# Patient Record
Sex: Male | Born: 1955
Health system: Southern US, Community
[De-identification: ages and names within clinical notes are randomized; demographics above are authoritative.]

## PROBLEM LIST (undated history)

## (undated) DIAGNOSIS — G4700 Insomnia, unspecified: Secondary | ICD-10-CM

## (undated) DIAGNOSIS — K648 Other hemorrhoids: Secondary | ICD-10-CM

## (undated) DIAGNOSIS — J301 Allergic rhinitis due to pollen: Secondary | ICD-10-CM

## (undated) DIAGNOSIS — K644 Residual hemorrhoidal skin tags: Secondary | ICD-10-CM

## (undated) DIAGNOSIS — M5416 Radiculopathy, lumbar region: Secondary | ICD-10-CM

## (undated) DIAGNOSIS — I10 Essential (primary) hypertension: Secondary | ICD-10-CM

## (undated) DIAGNOSIS — M549 Dorsalgia, unspecified: Secondary | ICD-10-CM

## (undated) DIAGNOSIS — D126 Benign neoplasm of colon, unspecified: Secondary | ICD-10-CM

## (undated) DIAGNOSIS — K5790 Diverticulosis of intestine, part unspecified, without perforation or abscess without bleeding: Secondary | ICD-10-CM

## (undated) HISTORY — DX: Other hemorrhoids: K64.8

## (undated) HISTORY — DX: Benign neoplasm of colon, unspecified: D12.6

## (undated) HISTORY — DX: Allergic rhinitis due to pollen: J30.1

## (undated) HISTORY — PX: BACK SURGERY: SHX140

## (undated) HISTORY — DX: Diverticulosis of intestine, part unspecified, without perforation or abscess without bleeding: K57.90

## (undated) HISTORY — DX: Insomnia, unspecified: G47.00

## (undated) HISTORY — DX: Residual hemorrhoidal skin tags: K64.4

## (undated) HISTORY — DX: Radiculopathy, lumbar region: M54.16

## (undated) HISTORY — PX: LUMBAR FUSION: SHX111

## (undated) HISTORY — PX: VASECTOMY: SHX75

---

## 2002-03-30 HISTORY — PX: LUMBAR DISC SURGERY: SHX700

## 2002-06-11 ENCOUNTER — Ambulatory Visit (HOSPITAL_COMMUNITY): Admission: RE | Admit: 2002-06-11 | Discharge: 2002-06-11 | Payer: Self-pay | Admitting: Neurosurgery

## 2002-06-11 ENCOUNTER — Encounter: Payer: Self-pay | Admitting: Neurosurgery

## 2002-12-13 ENCOUNTER — Encounter: Payer: Self-pay | Admitting: Neurosurgery

## 2002-12-15 ENCOUNTER — Encounter: Payer: Self-pay | Admitting: Neurosurgery

## 2002-12-15 ENCOUNTER — Ambulatory Visit (HOSPITAL_COMMUNITY): Admission: RE | Admit: 2002-12-15 | Discharge: 2002-12-16 | Payer: Self-pay | Admitting: Neurosurgery

## 2003-05-14 ENCOUNTER — Ambulatory Visit (HOSPITAL_COMMUNITY): Admission: RE | Admit: 2003-05-14 | Discharge: 2003-05-14 | Payer: Self-pay | Admitting: Neurosurgery

## 2004-04-29 ENCOUNTER — Inpatient Hospital Stay: Payer: Self-pay | Admitting: Internal Medicine

## 2004-08-23 ENCOUNTER — Emergency Department: Payer: Self-pay | Admitting: Emergency Medicine

## 2004-09-16 ENCOUNTER — Ambulatory Visit (HOSPITAL_COMMUNITY): Admission: RE | Admit: 2004-09-16 | Discharge: 2004-09-16 | Payer: Self-pay | Admitting: Neurosurgery

## 2004-09-26 ENCOUNTER — Emergency Department: Payer: Self-pay | Admitting: Unknown Physician Specialty

## 2005-01-18 ENCOUNTER — Emergency Department: Payer: Self-pay | Admitting: Internal Medicine

## 2005-04-14 ENCOUNTER — Inpatient Hospital Stay (HOSPITAL_COMMUNITY): Admission: RE | Admit: 2005-04-14 | Discharge: 2005-04-17 | Payer: Self-pay | Admitting: Neurosurgery

## 2005-05-18 ENCOUNTER — Emergency Department: Payer: Self-pay | Admitting: Emergency Medicine

## 2005-08-21 ENCOUNTER — Other Ambulatory Visit: Payer: Self-pay

## 2005-08-21 ENCOUNTER — Emergency Department: Payer: Self-pay | Admitting: Unknown Physician Specialty

## 2006-08-13 ENCOUNTER — Emergency Department: Payer: Self-pay | Admitting: Emergency Medicine

## 2006-10-23 ENCOUNTER — Emergency Department: Payer: Self-pay

## 2006-10-26 ENCOUNTER — Ambulatory Visit (HOSPITAL_COMMUNITY): Admission: RE | Admit: 2006-10-26 | Discharge: 2006-10-26 | Payer: Self-pay | Admitting: Neurosurgery

## 2006-11-07 ENCOUNTER — Inpatient Hospital Stay (HOSPITAL_COMMUNITY): Admission: EM | Admit: 2006-11-07 | Discharge: 2006-11-08 | Payer: Self-pay | Admitting: Emergency Medicine

## 2006-11-20 ENCOUNTER — Emergency Department (HOSPITAL_COMMUNITY): Admission: EM | Admit: 2006-11-20 | Discharge: 2006-11-20 | Payer: Self-pay | Admitting: Emergency Medicine

## 2006-11-24 ENCOUNTER — Emergency Department (HOSPITAL_COMMUNITY): Admission: EM | Admit: 2006-11-24 | Discharge: 2006-11-24 | Payer: Self-pay | Admitting: Emergency Medicine

## 2010-04-19 ENCOUNTER — Encounter: Payer: Self-pay | Admitting: Neurosurgery

## 2010-04-20 ENCOUNTER — Encounter: Payer: Self-pay | Admitting: Neurosurgery

## 2010-08-12 NOTE — Discharge Summary (Signed)
[name], [name]                ACCOUNT NO.:  [id number]   MEDICAL RECORD NO.:  [id number]          PATIENT TYPE:  INP   LOCATION:  3035                         FACILITY:  [facility]   PHYSICIAN:  Coletta Memos, M.D.     DATE OF BIRTH:  [birth year]   DATE OF ADMISSION:  11/07/2006  DATE OF DISCHARGE:  11/08/2006                               DISCHARGE SUMMARY   Jeffrey Mcbride is a gentleman who was admitted on November 07, 2006, being  discharged on November 08, 2006.   DISCHARGE DIAGNOSIS:  Back pain.   DISCHARGE DIAGNOSIS:  Back pain.   INDICATIONS:  Jeffrey Mcbride is a patient of Dr. Trey Sailors.  He presented  to the emergency room, after stating he was in so much pain he did not  want to do.  He came to the emergency room, and I contacted while in the  operating room.  At that point, the emergency room physician told Ms.  Stubblefield that he had the option that he should wait until I finish the  operation, so I could come down and evaluate him.  I saw Jeffrey Mcbride, and  he had no neurologic deficits.  He had no objective neurologic problems.  He stated he was in too much pain to go home.  He was therefore admitted  to the hospital.  Jeffrey Mcbride today stated that he wanted to leave the  hospital.  He said he felt that I was being aggressive towards him.  [name] was told numerous times that if he wished to remain in the hospital  he could, if he wished to leave he could.  Neurologically, again his  examination is normal and no deficits were identified.  Jeffrey Mcbride has  decided that he would like to leave.  He was given a prescription for  Valium, as he said that made him better than the Robaxin he had been  taking.           ______________________________  Coletta Memos, M.D.     KC/MEDQ  D:  11/08/2006  T:  11/09/2006  Job:  [id number]
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
## 2010-08-12 NOTE — H&P (Signed)
NAME:  YAMEN, CASTROGIOVANNI NO.:  [id number]   MEDICAL RECORD NO.:  [id number]           PATIENT TYPE:   LOCATION:                                 FACILITY:   PHYSICIAN:  Coletta Memos, M.D.          DATE OF BIRTH:   DATE OF ADMISSION:  DATE OF DISCHARGE:                              HISTORY & PHYSICAL   CONTINUATION   PHYSICAL EXAMINATION:  Mr. Derflinger is alert, oriented x4 and  answering all questions without  difficulty.  He is in absolutely no distress.  Pupils equal, round, and  react to light.  Full extraocular movements.  Tongue and uvula midline.  Shoulder shrug is normal.  Hearing intact to voice bilaterally.  He has  symmetric facial sensation and symmetric facial movements.  Normal  muscle tone, bulk and coordination.  He was lying down on a stretcher. I  did not assess his gait.  5/5 strength both the upper and lower  extremities.  Two plus reflexes at the biceps, triceps, brachial  radialis, knees and ankles.  Toes are downgoing to plantar stimulation.  No clonus was appreciated.  He has intact pinprick and light touch in  both the upper and lower extremities.  Proprioception intact in both the  upper and lower extremities.  He has no Hoffman sign appreciated on  examination.  There are no cervical masses or bruits.  Lung fields are  clear.  Pulses good at the wrists and feet bilaterally.  Heart exam  normal, no murmurs or rubs are appreciated.  No clubbing, cyanosis or  edema in the extremities.   RADIOGRAPHY:  Mr. Ceesay MRI was reviewed.  It shows cages in good  position.  He has granulation tissue with around the dissection site at  L5-S1.  Radiologist mentions disk material.  There is no disk material  there since he did have three cage fusion.  Plain films are done  flexion/extension views.  Unable to state whether or not he has a solid  fusion at that level.   ASSESSMENT:  Back pain.  I see no objective etiology for his continued  back pain.   Certainly that does not mean he does not hurt, but it does  mean there is very little to do I  believe clinically.  He will be  admitted for pain control.  I did explained that he may very well have  to pay for this out of his own pocket as many insurance companies will  not pay for admission secondary to pain control.  Nevertheless he would  like to be admitted.  He also understands the Dr. Channing Mutters is not available  in the early portion of next week.  No further diagnostic tests need to  be considered at this time.  Unless his neurologic exam were to change.           ______________________________  Coletta Memos, M.D.     KC/MEDQ  D:  11/07/2006  T:  11/08/2006  Job:  [id number]

## 2010-08-15 NOTE — H&P (Signed)
NAMEPHYLLIP, CLAW                ACCOUNT NO.:  [id number]   MEDICAL RECORD NO.:  [id number]          PATIENT TYPE:  INP   LOCATION:  2899                         FACILITY:  [facility]   PHYSICIAN:  Payton Doughty, M.D.      DATE OF BIRTH:  14-Jul-1955   DATE OF ADMISSION:  04/14/2005  DATE OF DISCHARGE:                                HISTORY & PHYSICAL   ADMITTING DIAGNOSIS:  Spondylosis L5-S1.   Self-referred 55 year old, right-handed white gentleman had a lumbar  diskectomy done about 3 years ago.  Has had some persistent but increasing  back pain and numbness of the left foot.  There is also some pain and spasm  in the right leg.  Undergone MR that shows loss of disc space height at L5-  S1, motic change at L5-S1, and is now admitted for fusion at that level.   MEDICAL HISTORY:  Otherwise benign.   MEDICATIONS:  1.  Robaxin 750 mg 3 times a day.  2.  Percocet 5 mg 3 times a day.  3.  Omeprazole 20 mg once a day.  4.  Atenolol 25 mg once a day.  5.  Elavil 50 mg at night.   He has no allergies.   SURGICAL HISTORY:  Only his diskectomy.   SOCIAL HISTORY:  He does not smoke and drink.  He was working in  [employer].  Cannot work because of back and leg pain.   REVIEW OF SYSTEMS:  Remarkable for glasses, tinnitus, hypertension, leg  pain, difficulty with urination, indigestion, leg weakness, back pain, leg  pain, joint pain, neck pain, difficulty with coordination, anxiety,  depression, and excessive thirst.   PHYSICAL EXAMINATION:  HEENT:  Within normal limits.  NECK:  He has reasonable range of motion in the neck.  CHEST:  Clear.  CARDIAC:  Regular rate and rhythm, without murmur.  ABDOMEN:  Nontender.  There is no hepatosplenomegaly.  EXTREMITIES:  Without clubbing or cyanosis.  GU:  Deferred.  PULSES:  Peripheral pulses are good.  NEUROLOGIC:  He is awake, alert, and oriented.  His cranial nerves are  intact.  Motor exam shows 5/5 strength throughout the upper and  lower  extremities, except for a little bit of plantar flexion weakness on the left  side.  Sensory deficit is in the left S1 distribution.  Reflexes are 2 at  the knees, 2 at the right ankle, absent at the left.  Straight leg raise and  reverse straight leg raise are both positive, and he has back pain.   MR results have been reviewed above.   CLINICAL IMPRESSION:  Lumbar spondylosis with progressive radiculopathy.   PLAN:  For lumbar laminectomy, diskectomy, posterior lumbar interbody fusion  with __________ fusion cages, and posterolateral arthrodesis.  The risks and  benefits of this approach have been discussed with him, and he wished to  proceed.           ______________________________  Payton Doughty, M.D.     MWR/MEDQ  D:  04/14/2005  T:  04/14/2005  Job:  [id number]

## 2010-08-15 NOTE — H&P (Signed)
NAME:  Jeffrey Mcbride, Jeffrey Mcbride                          ACCOUNT NO.:  [id number]   MEDICAL RECORD NO.:  [id number]                   PATIENT TYPE:  OIB   LOCATION:  3011                                 FACILITY:  [facility]   PHYSICIAN:  Payton Doughty, M.D.                   DATE OF BIRTH:  Dec 02, 1955   DATE OF ADMISSION:  12/15/2002  DATE OF DISCHARGE:                                HISTORY & PHYSICAL   ADMISSION DIAGNOSIS:  Herniated disk with left S1 radiculopathy.   HISTORY:  This is a 55 year old right-handed white gentleman whom I saw five  years ago with a disk of L5-S1 on the left.  He was lost to followup and  then reported back to my office in February with pain in his back and down  his left leg.  He was studied with a MRI which demonstrated a herniated disk  at L5-S1, off to the left side.  He received epidural steroids, did not  improve any, and now reports back for diskectomy at L5-S1.   PAST MEDICAL HISTORY:  Benign.   MEDICATIONS:  1. Methylcarbinol 750 mg t.i.d.  2. Percocet 5 mg t.i.d.  3. Omeprazole 20 mg once daily.  4. Atenolol 25 mg once daily.  5. Elavil 50 mg at night.   PAST SURGICAL HISTORY:  None.   ALLERGIES:  None.   SOCIAL HISTORY:  He does not smoke and does not drink.  Works in  [employer], but he says he cannot work now because of back and leg pain.   FAMILY HISTORY:  Not given.   REVIEW OF SYSTEMS:  Remarkable for glasses, tinnitus, hypertension, leg  pain, difficulty with urination, indigestion, leg weakness, back pain, joint  pain, neck pain, difficulty with coordination, anxiety, depression,  excessive thirst.   PHYSICAL EXAMINATION:  HEENT:  Within normal limits.  NECK:  Reasonable range of motion.  LUNGS:  Clear.  CARDIOVASCULAR:  Regular rate and rhythm without a murmur.  ABDOMEN:  Nontender without hepatosplenomegaly.  EXTREMITIES:  Without clubbing or cyanosis.  Peripheral pulses are good.  GENITOURINARY:  Deferred.  NEUROLOGICAL:  He is  awake, alert and oriented. Cranial nerves are intact.  Motor examination showed 5/5 strength throughout the upper and lower  extremities.  Questionable plantar flexion weakness on the left side.  Sensory deficit left S1 distribution. Reflexes are good, 2 at the knees, 2  at the right ankle, absent at the left. Straight leg raising and reverse  straight leg raising positive for left leg pain.   DIAGNOSTIC STUDIES:  MR demonstrated a disk at L5-S1 central and slightly  eccentric to the left.   CLINICAL IMPRESSION:  Left S1 radiculopathy related to disk.   PLAN:  Lumbar laminectomy and diskectomy. The risks and benefits of this  approach have been discussed with him; he wishes to proceed.  Payton Doughty, M.D.    MWR/MEDQ  D:  12/15/2002  T:  12/15/2002  Job:  [id number]

## 2010-08-15 NOTE — Op Note (Signed)
NAME:  Jeffrey Mcbride, Jeffrey Mcbride                          ACCOUNT NO.:  [id number]   MEDICAL RECORD NO.:  [id number]                   PATIENT TYPE:  OIB   LOCATION:  2870                                 FACILITY:  [facility]   PHYSICIAN:  Payton Doughty, M.D.                   DATE OF BIRTH:  1955/12/19   DATE OF PROCEDURE:  12/15/2002  DATE OF DISCHARGE:                                 OPERATIVE REPORT   PREOPERATIVE DIAGNOSIS:  Herniated disc on the left side at L5-S1.   POSTOPERATIVE DIAGNOSIS:  Herniated disc on the left side at L5-S1.   OPERATIVE PROCEDURE:  Left L5-S1 laminectomy, discectomy.   SURGEON:  Payton Doughty, M.D.   SERVICE:  Neurosurgery   ANESTHESIA:  General endotracheal anesthesia.   PREPARATION:  Betadine and alcohol wipe.   COMPLICATIONS:  None.   ASSISTANT:  Nurse   BODY OF TEXT:  This is a 55 year old gentleman with a left S1 radiculopathy.  He is taken to the operating room, general anesthesia, intubated, placed  prone on the operating table.  Following shave, prep, and drape in the usual  sterile fashion, the skin was infiltrated with 1% lidocaine with 1:100,000  epinephrine.  The skin was incised from the middle of L5 to the middle of S1  and the lamina of L5 and S1 were exposed on the left side in a subperiosteal  plane.  Interoperative x-ray confirmed correct level.  Having confirmed the  correct level, hemilaminectomy of L5 was carried out to the top of the  ligamentum flavum, it  was removed ina  retrograde fashion.  S1 was undercut  to allow foraminotomy over the top of the S1 nerve root.  The nerve root was  dissected free and retracted medially.  Immediately underneath it was found  a large calcified spur that was removed as well as disc material that is  compressing the left S1 nerve root as it entered the neuroforamen.  This was  removed without difficulty.  The annular fibers were divided and the disc  space carefully explored and all graftable fragments  were removed.  Having  removed the fragments, the wound was irrigated and hemostasis assured.  The  nerve root was carefully explored in all quadrants to make sure it was open.  Depo-Medrol soaked fat was used to cover the laminectomy defect.  The fascia  was reapproximated with 0 Vicryl in an interrupted fashion, the subcutaneous  tissues were reapproximated with 0 Vicryl in an interrupted fashion, the  subcuticular tissues were reapproximated with 3-0 Vicryl in an interrupted  fashion, and the skin was closed with 4-0 Vicryl in a running subcuticular  fashion.  Benzoin and Steri-Strips were placed and made occlusive with Telfa  and OpSite and the patient returned to the recovery room in good condition.      MWR/MEDQ  D:  12/15/2002  T:  12/15/2002  Job:  [id number]

## 2010-08-15 NOTE — Op Note (Signed)
NAMEBARNES, FLOREK                ACCOUNT NO.:  [id number]   MEDICAL RECORD NO.:  [id number]          PATIENT TYPE:  INP   LOCATION:  3172                         FACILITY:  [facility]   PHYSICIAN:  Payton Doughty, M.D.      DATE OF BIRTH:  1955/07/25   DATE OF PROCEDURE:  04/14/2005  DATE OF DISCHARGE:                                 OPERATIVE REPORT   PREOPERATIVE DIAGNOSIS:  Spondylosis L5-S1.   POSTOPERATIVE DIAGNOSIS:  Spondylosis L5-S1.   PROCEDURE:  L5-S1 laminectomy and diskectomy and posterior lumbar interbody  fusion with Ray Threaded fusion cages, posterolateral arthrodesis with  infusion extender   __________   ANESTHESIA:  General endotracheal.   __________   COMPLICATIONS:  None.   NEURO ASSISTANT  [place]   ASSISTANT:  Venetia Maxon.   BODY OF TEXT:  This is a 55 year old gentleman with severe spondylosis at L5-  S1.  He had had a previous diskectomy there.  He was taken to the operating  room __________  intubated.  Placed prone on the operating table.  Following  shave, prep and drape in the usual sterile fashion, the skin was infiltrated  with 1% lidocaine with 1:100,000 epinephrine and incised from mid L4 to mid  S1, and the lamina and transverse processes of L5 and the sacral ala were  exposed bilaterally in the subperiosteal plane.  Intraoperative x-ray  confirmed correct level.  The pars interarticularis, lamina and inferior  facet of L5 and superior facet of S1 were removed bilaterally using the high  speed drill.  Bone was set aside for grafting.  Ligamentum flavum was  removed, and the L5-S1 nerve roots were dissected free bilaterally.  Both S1  roots were very large and the L5 roots were of normal caliber.  There was  significant spondylosis, worse on the left than on the right.  Following  complete decompression, Ray Threaded fusion cages were placed, 12 x 21 mm,  without difficulty.  Intraoperative x-ray showed good placement of cages.  They were packed  with bone graft harvested from facet joints and capped, and  intertransverse BMP was placed after decortication.  The muscle and fascia  were reapproximated with 0 Vicryl in interrupted fashion.  The subcutaneous  tissues were reapproximated with 2-0 Vicryl in interrupted fashion.  The  skin was closed with 3-0 nylon in a running locked fashion.  Betadine and  Telfa dressing applied, made occlusive with OpSite, and then the patient  returned to the recovery room in good condition.           ______________________________  Payton Doughty, M.D.     MWR/MEDQ  D:  04/14/2005  T:  04/14/2005  Job:  [id number]

## 2010-08-15 NOTE — Discharge Summary (Signed)
NAMEROBYN, NOHR                ACCOUNT NO.:  [id number]   MEDICAL RECORD NO.:  [id number]          PATIENT TYPE:  INP   LOCATION:  3013                         FACILITY:  [facility]   PHYSICIAN:  Payton Doughty, M.D.      DATE OF BIRTH:  01-16-1956   DATE OF ADMISSION:  04/14/2005  DATE OF DISCHARGE:  04/17/2005                                 DISCHARGE SUMMARY   ADMISSION DIAGNOSIS:  Spondylosis L5-S1.   DISCHARGE DIAGNOSIS:  Spondylosis L5-S1.   OPERATION/PROCEDURE:  L5-S1 laminectomy, diskectomy, posterior lumbar  interbody fusion cages, and posterolateral arthrodesis.   SURGEON:  Payton Doughty, M.D.   COMPLICATIONS:  None.   CONDITION ON DISCHARGE:  Satisfactory __________ .   HISTORY OF PRESENT ILLNESS:  This is a 55 year old gentleman with history  and physical on the chart.  Had L5-S1 done several years ago. Has had  progression of spondylosis. Admitted for fusion.  Medical history is benign.  General exam is intact.  Neurologic exam is intact with back and lower  extremity pain.   HOSPITAL COURSE:  He was admitted after ascertaining normal laboratory  values and underwent fusion at L5-S1.  Postoperatively he has done well.  His strength is full. His incision is dry.  He had some nausea which kept  him in the bed for a day or so. He stopped the PCA and he was put back on  his Percocet.  He is now awake and doing fine.  Strength is full. Incision  dry and healing well.  He is up and walking independently. Pain is  controlled with Percocet.  He is being discharged home to the care of his  family on Percocet and ciprofloxacin.  He is to follow up with me at the  [facility] office in about 10 days for sutures.           ______________________________  Payton Doughty, M.D.     MWR/MEDQ  D:  04/17/2005  T:  04/17/2005  Job:  [id number]

## 2010-09-11 ENCOUNTER — Emergency Department: Payer: Self-pay | Admitting: Emergency Medicine

## 2011-01-12 LAB — CREATININE, SERUM
Creatinine, Ser: 1.4
GFR calc Af Amer: >60

## 2011-01-12 LAB — BUN: BUN: 11

## 2011-10-13 ENCOUNTER — Emergency Department (HOSPITAL_COMMUNITY): Payer: Medicare Other

## 2011-10-13 ENCOUNTER — Encounter (HOSPITAL_COMMUNITY): Payer: Self-pay

## 2011-10-13 ENCOUNTER — Emergency Department (HOSPITAL_COMMUNITY)
Admission: EM | Admit: 2011-10-13 | Discharge: 2011-10-13 | Disposition: A | Discharge status: Home / Self Care | Payer: Medicare Other | Attending: Emergency Medicine | Admitting: Emergency Medicine

## 2011-10-13 DIAGNOSIS — M545 Low back pain, unspecified: Secondary | ICD-10-CM | POA: Insufficient documentation

## 2011-10-13 DIAGNOSIS — I10 Essential (primary) hypertension: Secondary | ICD-10-CM | POA: Insufficient documentation

## 2011-10-13 DIAGNOSIS — IMO0002 Reserved for concepts with insufficient information to code with codable children: Secondary | ICD-10-CM

## 2011-10-13 DIAGNOSIS — M47817 Spondylosis without myelopathy or radiculopathy, lumbosacral region: Secondary | ICD-10-CM | POA: Insufficient documentation

## 2011-10-13 DIAGNOSIS — R209 Unspecified disturbances of skin sensation: Secondary | ICD-10-CM | POA: Insufficient documentation

## 2011-10-13 HISTORY — DX: Essential (primary) hypertension: I10

## 2011-10-13 HISTORY — DX: Dorsalgia, unspecified: M54.9

## 2011-10-13 MED ORDER — ONDANSETRON HCL 4 MG/2ML IJ SOLN
4.0000 mg | Freq: Once | INTRAMUSCULAR | Status: AC
  Administered 2011-10-13: 4 mg via INTRAVENOUS
  Filled 2011-10-13: qty 2

## 2011-10-13 MED ORDER — PREDNISONE 20 MG PO TABS: 60.0000 mg | ORAL_TABLET | Freq: Every day | ORAL | Status: AC

## 2011-10-13 MED ORDER — PREDNISONE 20 MG PO TABS: 60.0000 mg | ORAL_TABLET | Freq: Every day | ORAL | Status: DC

## 2011-10-13 MED ORDER — CYCLOBENZAPRINE HCL 10 MG PO TABS: 10.0000 mg | ORAL_TABLET | Freq: Two times a day (BID) | ORAL | Status: DC | PRN

## 2011-10-13 MED ORDER — OXYCODONE-ACETAMINOPHEN 5-325 MG PO TABS: 1.0000 | ORAL_TABLET | Freq: Four times a day (QID) | ORAL | Status: DC | PRN

## 2011-10-13 MED ORDER — SODIUM CHLORIDE 0.9 % IV BOLUS (SEPSIS)
500.0000 mL | Freq: Once | INTRAVENOUS | Status: AC
  Administered 2011-10-13: 500 mL via INTRAVENOUS

## 2011-10-13 MED ORDER — OXYCODONE-ACETAMINOPHEN 5-325 MG PO TABS: 1.0000 | ORAL_TABLET | Freq: Four times a day (QID) | ORAL | Status: AC | PRN

## 2011-10-13 MED ORDER — HYDROMORPHONE HCL PF 1 MG/ML IJ SOLN
1.0000 mg | Freq: Once | INTRAMUSCULAR | Status: AC
  Administered 2011-10-13: 1 mg via INTRAVENOUS
  Filled 2011-10-13: qty 1

## 2011-10-13 MED ORDER — CYCLOBENZAPRINE HCL 10 MG PO TABS: 10.0000 mg | ORAL_TABLET | Freq: Two times a day (BID) | ORAL | Status: AC | PRN

## 2011-10-13 NOTE — ED Provider Notes (Addendum)
History   This chart was scribed for Donnetta Hutching, MD by Charolett Bumpers . The patient was seen in room TR08C/TR08C.    CSN: [id number]  Arrival date & time 10/13/11  1459   First MD Initiated Contact with Patient 10/13/11 [phone]      Chief Complaint  Patient presents with  . Back Pain    (Consider location/radiation/quality/duration/timing/severity/associated sxs/prior treatment) HPI Jeffrey Mcbride is a 56 y.o. male who presents to the Emergency Department complaining of constant, moderate mid back pain for the past month, that has worsened in the past 2 days. Pt states that he was on vacation in mid June, where he may have twisted his back and has had constant back pain. Pt states that the back pain has worsened over the past 2 days. Pt states that he is able to ambulate with moderate pain. Pt reports associated left foot numbness but is not a new symptom, but states that it worse than usual. Pt reports a h/o 2 back surgeries one in 2004 and one in 2007, and has 2 titanium rods in his back near his L5. Pt states that his last MRI was in 2010. Pt states that he has not been seen by Dr. Channing Mutters for his current symptoms and was told to find a PCP. Pt states that he took Oxy IR this morning with no relief.   Back specialist: Dr. Channing Mutters  Past Medical History  Diagnosis Date  . Back pain   . Hypertension     Past Surgical History  Procedure Date  . Back surgery     History reviewed. No pertinent family history.  History  Substance Use Topics  . Smoking status: Never Smoker   . Smokeless tobacco: Not on file  . Alcohol Use: Yes     "very little"      Review of Systems A complete 10 system review of systems was obtained and all systems are negative except as noted in the HPI and PMH.   Allergies  Motrin  Home Medications  No current outpatient prescriptions on file.  BP 150/90  Pulse 53  Temp 98.2 F (36.8 C) (Oral)  Resp 18  SpO2 100%  Physical Exam  Nursing note  and vitals reviewed. Constitutional: He is oriented to person, place, and time. He appears well-developed and well-nourished. No distress.  HENT:  Head: Normocephalic and atraumatic.  Eyes: EOM are normal.  Neck: Neck supple. No tracheal deviation present.  Pulmonary/Chest: Effort normal. No respiratory distress.  Musculoskeletal: Normal range of motion. He exhibits tenderness.       Tenderness to left L1 spine.   Neurological: He is alert and oriented to person, place, and time.  Skin: Skin is warm and dry.  Psychiatric: He has a normal mood and affect. His behavior is normal.    ED Course  Procedures (including critical care time)  DIAGNOSTIC STUDIES: Oxygen Saturation is 100% on room air, normal by my interpretation.    COORDINATION OF CARE:  15:25-Discussed planned course of treatment with the patient including pain medication and a x-ray, who is agreeable at this time.   15:41-Medication Orders: Sodium chloride 0.9% bolus 500 mL-once; Ondansetron (Zofran) injection 4 mg-once; Hydromorphone (Dilaudid) injection 1 mg-once   Labs Reviewed - No data to display No results found.   No diagnosis found.  Dg Lumbar Spine Complete  10/13/2011  *RADIOLOGY REPORT*  Clinical Data: Increasing chronic low back pain.  History of lumbar surgery.  LUMBAR SPINE - COMPLETE 4+  VIEW  Comparison: The lumbar spine MRI S/29/2008.  Plain film radiographs of the lumbar spine 10/26/2006.  Findings: The patient is status post fusion at L5-S1.  Alignment is stable.  Vertebral body heights and disc spaces are otherwise maintained.  Slight retrolisthesis present at L4-5.  Facet degenerative changes are again noted at to L4-5.  IMPRESSION:  1.  Mild facet degenerative changes at L4-5 with slight retrolisthesis is stable. 2.  Status post fusion at L5-S1. 3.  No acute or focal abnormality to explain the patient's symptoms.  Original Report Authenticated By: Jamesetta Orleans. MATTERN, M.D.   Mr Lumbar Spine Wo  Contrast  10/13/2011  *RADIOLOGY REPORT*  Clinical Data: Increasing low back pain radiating to left foot. Prior back surgery 2008  MRI LUMBAR SPINE WITHOUT CONTRAST  Technique:  Multiplanar and multiecho pulse sequences of the lumbar spine were obtained without intravenous contrast.  Comparison: Lumbar MRI 10/26/2006  Findings: Ray cage fusion L5-S1.  Negative for fracture or mass. The alignment is normal.  Conus medullaris is normal and terminates at T12-L1.  Right upper pole renal cyst.  T12-L1:  Negative  L1-2:  Negative  L2-3:  Negative.  L3-4:  Central disc protrusion, mild to moderate in size.  This has increased from the prior study.  Mild facet hypertrophy.  Mild spinal stenosis has progressed in the interval.  L4-5:  Disc degeneration with mild spondylosis.  Mild facet hypertrophy.  No change from the  prior MRI.  L5-S1:  Ray cage fusion.  Spondylosis is present.  Posterior decompression without significant spinal stenosis.  IMPRESSION: Mild to moderate central disc protrusion at L3-4 with mild spinal stenosis.  Spondylosis and facet degeneration at L4-5, stable.  Bilateral ray cage fusion L5-S1, unchanged.  Original Report Authenticated By: Camelia Phenes, M.D.    MDM  Status post previous lower back surgery by Dr. Channing Mutters with titanium rod insertion.  Assistant lower back pain and radiation to left foot.   Pain  management, plain films, MRI lumbar spine.  Transfer to CDU. I personally performed the services described in this documentation, which was scribed in my presence. The recorded information has been reviewed and considered.  MRI suggests L3-4 herniated nucleus pulposus.  Referral to neurosurgeon. Rx for Percocet, Flexeril, prednisone.  No bowel or bladder incontinence       Donnetta Hutching, MD 10/13/11 1604  Donnetta Hutching, MD 10/13/11 2013

## 2011-10-13 NOTE — ED Provider Notes (Signed)
MSE was initiated and I personally evaluated the patient and placed orders (if any) at  8:50 PM on October 13, 2011.  The remainder of the MSE may be completed by another ED provider. Reprinted prescriptions as discharge instructions misplaced   Arman Filter, NP 10/13/11 2050

## 2011-10-13 NOTE — ED Notes (Signed)
Pt states "I took an oxy IR this morning and it didn't help."

## 2011-10-13 NOTE — ED Notes (Signed)
Pt states "I have a cage in my back and had multiple surgeries. I went on vacation and my hips got out of whack and now I can't even put my socks and shoes on. This morning I could barely get out of bed and I can't handle it and I think I messed something up and need pain medication." Denies any "popping or cracking."

## 2011-10-13 NOTE — ED Notes (Signed)
Pt. Denies any new numbness or tingling in his extremities.  Pt. Reports that he has had lt. Foot numbness since his first  Lower back surgery.   Pt. Denies any problems with voiding or moving his bowels

## 2011-10-14 NOTE — ED Provider Notes (Signed)
Medical screening examination/treatment/procedure(s) were performed by non-physician practitioner and as supervising physician I was immediately available for consultation/collaboration.  Donnetta Hutching, MD 10/14/11 [phone]

## 2014-08-10 ENCOUNTER — Emergency Department
Admission: EM | Admit: 2014-08-10 | Discharge: 2014-08-10 | Disposition: A | Discharge status: Home / Self Care | Payer: Medicare Other | Attending: Emergency Medicine | Admitting: Emergency Medicine

## 2014-08-10 ENCOUNTER — Other Ambulatory Visit: Payer: Self-pay

## 2014-08-10 ENCOUNTER — Emergency Department: Payer: Medicare Other

## 2014-08-10 DIAGNOSIS — G8929 Other chronic pain: Secondary | ICD-10-CM | POA: Diagnosis not present

## 2014-08-10 DIAGNOSIS — M545 Low back pain: Secondary | ICD-10-CM | POA: Diagnosis present

## 2014-08-10 DIAGNOSIS — M5442 Lumbago with sciatica, left side: Secondary | ICD-10-CM | POA: Diagnosis not present

## 2014-08-10 DIAGNOSIS — I10 Essential (primary) hypertension: Secondary | ICD-10-CM | POA: Diagnosis not present

## 2014-08-10 DIAGNOSIS — M5432 Sciatica, left side: Secondary | ICD-10-CM

## 2014-08-10 LAB — BASIC METABOLIC PANEL
Anion gap: 7 (ref 5–15)
BUN: 14 mg/dL (ref 6–20)
CALCIUM: 8.9 mg/dL (ref 8.9–10.3)
CHLORIDE: 103 mmol/L (ref 101–111)
CO2: 30 mmol/L (ref 22–32)
Creatinine, Ser: 1.2 mg/dL (ref 0.61–1.24)
GFR calc Af Amer: >60 mL/min (ref >60)
GFR calc non Af Amer: >60 mL/min (ref >60)
GLUCOSE: 103 mg/dL — AB (ref 65–99)
POTASSIUM: 3.7 mmol/L (ref 3.5–5.1)
SODIUM: 140 mmol/L (ref 135–145)

## 2014-08-10 LAB — CBC
HEMATOCRIT: 41.8 % (ref 40.0–52.0)
Hemoglobin: 14 g/dL (ref 13.0–18.0)
MCH: 29.8 pg (ref 26.0–34.0)
MCHC: 33.5 g/dL (ref 32.0–36.0)
MCV: 88.8 fL (ref 80.0–100.0)
PLATELETS: 222 10*3/uL (ref 150–440)
RBC: 4.71 MIL/uL (ref 4.40–5.90)
RDW: 13.4 % (ref 11.5–14.5)
WBC: 10.9 10*3/uL — AB (ref 3.8–10.6)

## 2014-08-10 LAB — URINALYSIS COMPLETE WITH MICROSCOPIC (ARMC ONLY)
BILIRUBIN URINE: NEGATIVE
Bacteria, UA: NONE SEEN
GLUCOSE, UA: NEGATIVE mg/dL
HGB URINE DIPSTICK: NEGATIVE
LEUKOCYTES UA: NEGATIVE
NITRITE: NEGATIVE
PH: 9 — AB (ref 5.0–8.0)
PROTEIN: NEGATIVE mg/dL
Specific Gravity, Urine: 1.015 (ref 1.005–1.030)
Squamous Epithelial / LPF: NONE SEEN

## 2014-08-10 MED ORDER — ONDANSETRON HCL 4 MG/2ML IJ SOLN
4.0000 mg | Freq: Once | INTRAMUSCULAR | Status: AC
  Administered 2014-08-10: 4 mg via INTRAVENOUS

## 2014-08-10 MED ORDER — ONDANSETRON HCL 4 MG/2ML IJ SOLN
INTRAMUSCULAR | Status: AC
  Administered 2014-08-10: 4 mg via INTRAVENOUS
  Filled 2014-08-10: qty 2

## 2014-08-10 MED ORDER — PREDNISONE 20 MG PO TABS
60.0000 mg | ORAL_TABLET | Freq: Once | ORAL | Status: AC
  Administered 2014-08-10: 60 mg via ORAL

## 2014-08-10 MED ORDER — HYDROMORPHONE HCL 1 MG/ML IJ SOLN
1.0000 mg | Freq: Once | INTRAMUSCULAR | Status: AC
  Administered 2014-08-10: 1 mg via INTRAVENOUS

## 2014-08-10 MED ORDER — HYDROMORPHONE HCL 1 MG/ML IJ SOLN
INTRAMUSCULAR | Status: AC
  Administered 2014-08-10: 1 mg via INTRAVENOUS
  Filled 2014-08-10: qty 1

## 2014-08-10 MED ORDER — PREDNISONE 10 MG PO TABS: ORAL_TABLET | ORAL | Status: DC

## 2014-08-10 MED ORDER — PREDNISONE 20 MG PO TABS
ORAL_TABLET | ORAL | Status: AC
  Administered 2014-08-10: 60 mg via ORAL
  Filled 2014-08-10: qty 3

## 2014-08-10 MED ORDER — OXYCODONE-ACETAMINOPHEN 5-325 MG PO TABS: 1.0000 | ORAL_TABLET | ORAL | Status: DC | PRN

## 2014-08-10 NOTE — ED Notes (Addendum)
Pt states he has been having lower back pain and weakness in his legs, also c/o headache and dizziness, states all of this started yesterday, hx of HTN, pt states he also felt like he was going to pass out while walking into the ED

## 2014-08-10 NOTE — Discharge Instructions (Signed)
°  You're being treated for acute exacerbation of chronic back pain due to presumed pinched nerve. Return to the emergency department for any new or worsening back pain, leg pain, weakness, numbness, or peeing or pooping on yourself. You need to establish with a primary care doctor who can consider further evaluation and management of your back pain.  Radicular Pain Radicular pain in either the arm or leg is usually from a bulging or herniated disk in the spine. A piece of the herniated disk may press against the nerves as the nerves exit the spine. This causes pain which is felt at the tips of the nerves down the arm or leg. Other causes of radicular pain may include:  Fractures.  Heart disease.  Cancer.  An abnormal and usually degenerative state of the nervous system or nerves (neuropathy). Diagnosis may require CT or MRI scanning to determine the primary cause.  Nerves that start at the neck (nerve roots) may cause radicular pain in the outer shoulder and arm. It can spread down to the thumb and fingers. The symptoms vary depending on which nerve root has been affected. In most cases radicular pain improves with conservative treatment. Neck problems may require physical therapy, a neck collar, or cervical traction. Treatment may take many weeks, and surgery may be considered if the symptoms do not improve.  Conservative treatment is also recommended for sciatica. Sciatica causes pain to radiate from the lower back or buttock area down the leg into the foot. Often there is a history of back problems. Most patients with sciatica are better after 2 to 4 weeks of rest and other supportive care. Short term bed rest can reduce the disk pressure considerably. Sitting, however, is not a good position since this increases the pressure on the disk. You should avoid bending, lifting, and all other activities which make the problem worse. Traction can be used in severe cases. Surgery is usually reserved for  patients who do not improve within the first months of treatment. Only take over-the-counter or prescription medicines for pain, discomfort, or fever as directed by your caregiver. Narcotics and muscle relaxants may help by relieving more severe pain and spasm and by providing mild sedation. Cold or massage can give significant relief. Spinal manipulation is not recommended. It can increase the degree of disc protrusion. Epidural steroid injections are often effective treatment for radicular pain. These injections deliver medicine to the spinal nerve in the space between the protective covering of the spinal cord and back bones (vertebrae). Your caregiver can give you more information about steroid injections. These injections are most effective when given within two weeks of the onset of pain.  You should see your caregiver for follow up care as recommended. A program for neck and back injury rehabilitation with stretching and strengthening exercises is an important part of management.  SEEK IMMEDIATE MEDICAL CARE IF:  You develop increased pain, weakness, or numbness in your arm or leg.  You develop difficulty with bladder or bowel control.  You develop abdominal pain. Document Released: 04/23/2004 Document Revised: 06/08/2011 Document Reviewed: 07/09/2008 [facility] Patient Information 2015 [place], [place]. This information is not intended to replace advice given to you by your health care provider. Make sure you discuss any questions you have with your health care provider.

## 2014-08-10 NOTE — ED Notes (Signed)
Returned from radiology. 

## 2014-08-10 NOTE — ED Provider Notes (Signed)
[facility] Emergency Department Provider Note   ____________________________________________  Time seen: 3:30 PM  I have reviewed the triage vital signs and the triage nursing note.   HISTORY  Chief Complaint Dizziness; Back Pain; and Headache   HPI Jeffrey Mcbride is a 59 y.o. male is presenting with 2-3 days of moderate to severe low back pain with numbness of the left lower extremity down to the knee. He believes he has "slipped a disc. "He has a history of chronic back pain and L5 "rods ". He's had no bowel or bladder incontinence. He does report having a hemorrhoid which has created a small amount of rectal bleeding. He says he's been shaking due to pain. He's had trouble sleeping due to pain. He's been taking Tylenol and ibuprofen at home for the past 2-3 days.      Past Medical History  Diagnosis Date  . Back pain   . Hypertension     There are no active problems to display for this patient.   Past Surgical History  Procedure Laterality Date  . Back surgery      Current Outpatient Rx  Name  Route  Sig  Dispense  Refill  . acetaminophen (TYLENOL) 500 MG tablet   Oral   Take 250-500 mg by mouth every 6 (six) hours as needed. For pain         . oxyCODONE (OXY IR/ROXICODONE) 5 MG immediate release tablet   Oral   Take 5 mg by mouth daily as needed. For pain           Allergies Motrin  No family history on file.  Social History History  Substance Use Topics  . Smoking status: Never Smoker   . Smokeless tobacco: Not on file  . Alcohol Use: Yes     Comment: "very little"    Review of Systems  Constitutional: Negative for fever. Eyes: Negative for visual changes. ENT: Negative for sore throat. Cardiovascular: Negative for chest pain. Respiratory: Negative for shortness of breath. Gastrointestinal: Mild gas today after a gravy biscuit with red pepper Genitourinary: Negative for dysuria. Musculoskeletal: No joint  problems. Skin: Negative for rash. Neurological: Negative for headaches, focal weakness or numbness.   ____________________________________________   PHYSICAL EXAM:  VITAL SIGNS: ED Triage Vitals  Enc Vitals Group     BP 08/10/14 1312 161/86 mmHg     Pulse Rate 08/10/14 1312 65     Resp 08/10/14 1312 18     Temp 08/10/14 1312 98.5 F (36.9 C)     Temp Source 08/10/14 1312 Oral     SpO2 08/10/14 1312 99 %     Weight 08/10/14 1311 145 lb (65.772 kg)     Height 08/10/14 1311 5\' 9"  (1.753 m)     Head Cir --      Peak Flow --      Pain Score 08/10/14 1311 10     Pain Loc --      Pain Edu? --      Excl. in [place]? --      Constitutional: Alert and oriented. Well appearing but wincing due to pain in the back. Eyes: Conjunctivae are normal. PERRL. Normal extraocular movements. ENT   Head: Normocephalic and atraumatic.   Nose: No congestion/rhinnorhea.   Mouth/Throat: Mucous membranes are moist. Poor dentition   Neck: No stridor. Cardiovascular: Normal rate, regular rhythm.  No murmurs, rubs, or gallops. Respiratory: Normal respiratory effort without tachypnea nor retractions. Breath sounds are clear and  equal bilaterally. No wheezes/rales/rhonchi. Gastrointestinal: Soft and nontender. No distention.  Genitourinary: Musculoskeletal: Tenderness in the low back paraspinous bilaterally. Nontender with normal range of motion in all extremities. No joint effusions.  No lower extremity tenderness nor edema. Neurologic:  Normal speech and language. No gross focal neurologic deficits are appreciated. Speech is normal. No gait instability. Skin:  Skin is warm, dry and intact. No rash noted. Psychiatric: Mood and affect are normal. Speech and behavior are normal. Patient exhibits appropriate insight and judgment.  ____________________________________________   EKG  62 bpm. Normal sinus rhythm. Normal axis. Normal QRS. Normal ST and  T-wave.   ____________________________________________   LABS (pertinent positives/negatives)  White blood cell count 10.9 Hemoglobin 14.0 Electrolytes within normal limits Urinalysis negative  ____________________________________________    RADIOLOGY Radiologist results reviewed  Lumbar spine 2 view: Stable postoperative changes at L5-S1  ____________________________________________   PROCEDURES  Procedure(s) performed:  Critical Care performed:   ____________________________________________   INITIAL IMPRESSION / ASSESSMENT AND PLAN / ED COURSE  Pertinent labs & imaging results that were available during my care of the patient were reviewed by me and considered in my medical decision making (see chart for details).  Clinically I suspect acute exacerbation of chronic back pain with left-sided sciatica. There is no indication on his history of physical exam to make me concern for a spinal emergency such as epidural abscess, or cauda equina, etc.  Patient not symptomatic relief after Dilaudid. However the pain did come back and he was given a second dose. Patient states he cannot tolerate ibuprofen and that a course of prednisone had worked in the past for anti-inflammatory pain relief and I will go ahead and give a prescription for this. He'll be given 3 days worth of Percocet for his acute back pain. He is instructed to follow with primary care doctor and states that he would like to get a new primary care doctor and will be given contact operation for credit clinic. Return precautions discussed with the patient and his wife. ____________________________________________   FINAL CLINICAL IMPRESSION(S) / ED DIAGNOSES  Acute exacerbation of chronic low back pain with left-sided sciatica     Lisa Roca, MD 08/10/14 1843

## 2014-08-12 ENCOUNTER — Emergency Department (HOSPITAL_COMMUNITY)
Admission: EM | Admit: 2014-08-12 | Discharge: 2014-08-12 | Disposition: A | Discharge status: Home / Self Care | Payer: Medicare Other | Attending: Emergency Medicine | Admitting: Emergency Medicine

## 2014-08-12 ENCOUNTER — Encounter (HOSPITAL_COMMUNITY): Payer: Self-pay | Admitting: Emergency Medicine

## 2014-08-12 DIAGNOSIS — Z9889 Other specified postprocedural states: Secondary | ICD-10-CM | POA: Diagnosis not present

## 2014-08-12 DIAGNOSIS — Z79899 Other long term (current) drug therapy: Secondary | ICD-10-CM | POA: Diagnosis not present

## 2014-08-12 DIAGNOSIS — G8929 Other chronic pain: Secondary | ICD-10-CM | POA: Diagnosis not present

## 2014-08-12 DIAGNOSIS — M545 Low back pain: Secondary | ICD-10-CM | POA: Diagnosis not present

## 2014-08-12 DIAGNOSIS — I10 Essential (primary) hypertension: Secondary | ICD-10-CM | POA: Diagnosis not present

## 2014-08-12 MED ORDER — HYDROMORPHONE HCL 1 MG/ML IJ SOLN
1.0000 mg | Freq: Once | INTRAMUSCULAR | Status: AC
Start: 2014-08-12 — End: 2014-08-12
  Administered 2014-08-12: 1 mg via INTRAVENOUS
  Filled 2014-08-12: qty 1

## 2014-08-12 MED ORDER — METHOCARBAMOL 1000 MG/10ML IJ SOLN
1000.0000 mg | Freq: Once | INTRAVENOUS | Status: AC
  Administered 2014-08-12: 1000 mg via INTRAVENOUS
  Filled 2014-08-12: qty 10

## 2014-08-12 MED ORDER — CYCLOBENZAPRINE HCL 10 MG PO TABS: 10.0000 mg | ORAL_TABLET | Freq: Two times a day (BID) | ORAL | Status: DC | PRN

## 2014-08-12 MED ORDER — KETOROLAC TROMETHAMINE 30 MG/ML IJ SOLN
30.0000 mg | Freq: Once | INTRAMUSCULAR | Status: AC
  Administered 2014-08-12: 30 mg via INTRAVENOUS
  Filled 2014-08-12: qty 1

## 2014-08-12 MED ORDER — METHOCARBAMOL 1000 MG/10ML IJ SOLN: 1000.0000 mg | Freq: Once | INTRAMUSCULAR | Status: DC

## 2014-08-12 NOTE — ED Provider Notes (Signed)
CSN: [id number]     Arrival date & time 08/12/14  1253 History   First MD Initiated Contact with Patient 08/12/14 1253     Chief Complaint  Patient presents with  . Back Pain    chronic     (Consider location/radiation/quality/duration/timing/severity/associated sxs/prior Treatment) HPI Comments: The patient is a 59 year old male, he has a history of low back pain, he has a history of intermittent flareups since approximately 15 years ago. He also has a history of chronic left foot pain and numbness secondary to his low back since 2004 when he had surgery. He reports that his most recent episode of pain started several days ago, is located in the lower back, radiates down the left leg, is associated with muscle spasm, he feels as though he has a constant tremor and take comfortable, he had been seen at an emergency Department at [facility], was evaluated with lumbar spine films, was given pain medications and had significant improvement in his symptoms. He was discharged home with Percocet, he has been taking it sparingly per his report and last night developed worsening pain in his lower back, has been off the majority of the evening  as he was unable to get comfortable. paramedics gave the patient 200 g of fentanyl with some improvement, the patient denies any urinary or stool incontinence, no new numbness or weakness of the legs in fact he has been ambulatory today without difficulty in his legs though it does make his back worse to ambulate.  Patient is a 59 y.o. male presenting with back pain. The history is provided by the patient.  Back Pain   Past Medical History  Diagnosis Date  . Back pain   . Hypertension    Past Surgical History  Procedure Laterality Date  . Back surgery     History reviewed. No pertinent family history. History  Substance Use Topics  . Smoking status: Never Smoker   . Smokeless tobacco: Not on file  . Alcohol Use: Yes     Comment: "very little"     Review of Systems  Musculoskeletal: Positive for back pain.  All other systems reviewed and are negative.     Allergies  Motrin  Home Medications   Prior to Admission medications   Medication Sig Start Date End Date Taking? Authorizing Provider  acetaminophen (TYLENOL) 500 MG tablet Take 250-500 mg by mouth every 6 (six) hours as needed for headache.    Yes Historical Provider, MD  MELATONIN PO Take 0.25 tablets by mouth at bedtime as needed (for sleep).   Yes Historical Provider, MD  Multiple Vitamin (MULTIVITAMIN WITH MINERALS) TABS tablet Take 1 tablet by mouth daily. Gummy vitamin   Yes Historical Provider, MD  oxyCODONE-acetaminophen (ROXICET) 5-325 MG per tablet Take 1 tablet by mouth every 4 (four) hours as needed for severe pain. 08/10/14  Yes Lisa Roca, MD  predniSONE (DELTASONE) 10 MG tablet 40mg  daily for 2 days 30 mg daily for 2 days 20 mg daily for 2 days 10mg  daily for 2 days 08/10/14  Yes Lisa Roca, MD  cyclobenzaprine (FLEXERIL) 10 MG tablet Take 1 tablet (10 mg total) by mouth 2 (two) times daily as needed for muscle spasms. 08/12/14   Noemi Chapel, MD   BP 146/69 mmHg  Pulse 47  Temp(Src) 98.2 F (36.8 C) (Oral)  Resp 20  SpO2 99% Physical Exam  Constitutional: He appears well-developed and well-nourished. No distress.  HENT:  Head: Normocephalic and atraumatic.  Mouth/Throat: Oropharynx is  clear and moist. No oropharyngeal exudate.  Eyes: Conjunctivae and EOM are normal. Pupils are equal, round, and reactive to light. Right eye exhibits no discharge. Left eye exhibits no discharge. No scleral icterus.  Neck: Normal range of motion. Neck supple. No JVD present. No thyromegaly present.  Cardiovascular: Normal rate, regular rhythm, normal heart sounds and intact distal pulses.  Exam reveals no gallop and no friction rub.   No murmur heard. Pulmonary/Chest: Effort normal and breath sounds normal. No respiratory distress. He has no wheezes. He has no  rales.  Abdominal: Soft. Bowel sounds are normal. He exhibits no distension and no mass. There is no tenderness.  Musculoskeletal: Normal range of motion. He exhibits tenderness (tender to palpation across the lumbar spine). He exhibits no edema.  Pain with movement of the bilateral legs located in the thighs, calves and lower back, supple joints, soft compartments  Lymphadenopathy:    He has no cervical adenopathy.  Neurological: He is alert. Coordination normal.  Skin: Skin is warm and dry. No rash noted. No erythema.  Psychiatric: He has a normal mood and affect. His behavior is normal.  Nursing note and vitals reviewed.   ED Course  Procedures (including critical care time) Labs Review Labs Reviewed - No data to display  Imaging Review Dg Lumbar Spine 2-3 Views  08/10/2014   CLINICAL DATA:  Acute onset lumbago with left lower extremity numbness after bending  EXAM: LUMBAR SPINE - 2-3 VIEW  COMPARISON:  December 02, 2011  FINDINGS: Frontal, lateral, and spot lumbosacral lateral images were obtained. The there are 5 non-rib-bearing lumbar type vertebral bodies. The patient has had previous surgery at L5-S1 with spacers present in the L5-S1 disc level, unchanged. There is moderately severe disc narrowing at L5-S1. There is moderate narrowing at L4-5. There is slight narrowing at L3-4. There is no fracture or spondylolisthesis. Anterior osteophytes are noted at L4 and L5.  IMPRESSION: No fracture or spondylolisthesis. Stable postoperative change at L5-S1. Osteoarthritic change is noted in the lower lumbar spine, most severe at L5-S1.   Electronically Signed   By: Lowella Grip III M.D.   On: 08/10/2014 16:33     MDM   Final diagnoses:  Low back pain, unspecified back pain laterality, with sciatica presence unspecified    The patient has vital signs consistent with mild hypertension, there is no tachycardia feve  or difficulty breathing, he had an EKG performed at the other hospital  2 days ago, he has not been seen frequently for pain complaints, he has been referred to neurosurgery and has seen them, not thought to have a surgical condition at this time, last MRI was 2013   MRI from 2013 IMPRESSION: Mild to moderate central disc protrusion at L3-4 with mild spinal stenosis.  Spondylosis and facet degeneration at L4-5, stable.  Bilateral ray cage fusion L5-S1, unchanged.  IMproved sx after medications - comfortable for d/c - will add muscle relaxant and pa has bottle with percocet left (several).  Meds given in ED:  Medications  HYDROmorphone (DILAUDID) injection 1 mg (1 mg Intravenous Given 08/12/14 1329)  ketorolac (TORADOL) 30 MG/ML injection 30 mg (30 mg Intravenous Given 08/12/14 1329)  methocarbamol (ROBAXIN) 1,000 mg in dextrose 5 % 50 mL IVPB (0 mg Intravenous Stopped 08/12/14 1455)    New Prescriptions   CYCLOBENZAPRINE (FLEXERIL) 10 MG TABLET    Take 1 tablet (10 mg total) by mouth 2 (two) times daily as needed for muscle spasms.  Noemi Chapel, MD 08/12/14 [phone]

## 2014-08-12 NOTE — ED Notes (Signed)
Per EMS- patient has hx of chronic back pain. Had a flare up, seen recently and given Dilaudid shot and prescription for oxycodone but states he ran out. Pt states no specific injury and story changed multiple times. Unable to tell EMS if he has been to a pain clinic or not. Pt was shaking when EMS arrived. Placed on LSB for precautions since patient's story was changing. 200 Fentanyl given in route to LAC 18G PIV.

## 2014-08-12 NOTE — Discharge Instructions (Signed)
Back Pain: ° ° °Your back pain should be treated with medicines such as ibuprofen or aleve and this back pain should get better over the next 2 weeks.  However if you develop severe or worsening pain, low back pain with fever, numbness, weakness or inability to walk or urinate, you should return to the ER immediately.  Please follow up with your doctor this week for a recheck if still having symptoms. °Low back pain is discomfort in the lower back that may be due to injuries to muscles and ligaments around the spine.  Occasionally, it may be caused by a a problem to a part of the spine called a disc.  The pain may last several days or a week;  However, most patients get completely well in 4 weeks. ° °Self - care:  The application of heat can help soothe the pain.  Maintaining your daily activities, including walking, is encourged, as it will help you get better faster than just staying in bed. ° °Medications are also useful to help with pain control.  A commonly prescribed medications includes acetaminophen.  This medication is generally safe, though you should not take more than 8 of the extra strength (500mg) pills a day. ° °Non steroidal anti inflammatory medications including Ibuprofen and naproxen;  These medications help both pain and swelling and are very useful in treating back pain.  They should be taken with food, as they can cause stomach upset, and more seriously, stomach bleeding.   ° °Muscle relaxants:  These medications can help with muscle tightness that is a cause of lower back pain.  Most of these medications can cause drowsiness, and it is not safe to drive or use dangerous machinery while taking them. ° °You will need to follow up with  Your primary healthcare provider in 1-2 weeks for reassessment. ° °Be aware that if you develop new symptoms, such as a fever, leg weakness, difficulty with or loss of control of your urine or bowels, abdominal pain, or more severe pain, you will need to seek  medical attention and  / or return to the Emergency department. ° °If you do not have a doctor see the list below. ° °RESOURCE GUIDE ° °Chronic Pain Problems: °Contact [facility]  [phone] °Patients need to be referred by their primary care doctor. ° °Insufficient Money for Medicine: °Contact United Way:  call "211" or [facility] 271-5999. ° °No Primary Care Doctor: °- Call [facility]  832-8000 - can help you locate a primary care doctor that  accepts your insurance, provides certain services, etc. °- Physician Referral Service- 1-800-533-3463 ° °Agencies that provide inexpensive medical care: °- [facility]  832-8035 °- [facility]  832-7272 °- [facility]  [phone] °- [facility]  832-4777 °- [facility]  373-0678 °- [facility]  272-1050 ° °Medicaid-accepting [place] Providers: °- [facility]- [address] ° [phone], Mon-Fri 9am-7pm, Sat 9am-1pm °- [facility]- [address], [address] ° [phone] °- [facility]- [address], [address] ° [phone] °- [facility]- [address] [place] [address] ° [phone] °- [facility]- [address], [address], [phone] ° Only accepts [place] Access Medicaid patients after they have their name  applied to their card ° °Self Pay (no insurance) in [place]: °- Sickle Cell Patients: Dr Eric Dean, [facility] °   [address], [phone] °- [facility]- [address] ° [phone] °      -     [facility]- [address] °      -     [facility]- see information above (Speak to Pam H if you do not have insurance) °      -  [facility]- [address], [phone] °      -  [facility] [place]- [address],  [phone] °      -  [facility]- [address] [place] [address],  [phone] °      -  Dr Osei-Bonsu-  [address], [place], [phone] °      -  [facility]- [address], [phone] °      -  [facility]- [address] [place] [address], [phone], also [address], [phone] °      -    [facility]- [address], [phone], 1st & 3rd Saturday   every month, 10am-1pm ° °1) Find a Doctor and Pay Out of Pocket °Although you won't have to find out who is covered by your insurance plan, it is a good idea to ask around and get recommendations. You will then need to call the office and see if the doctor you have chosen will accept you as a new patient and what types of options they offer for patients who are self-pay. Some doctors offer discounts or will set up payment plans for their patients who do not have insurance, but you will need to ask so you aren't surprised when you get to your appointment. ° °2) Contact Your Local Health Department °Not all health departments have doctors that can see patients for sick visits, but many do, so it is worth a call to see if yours does. If you don't know where your local health department is, you can check in your phone book. The CDC also has a tool to help you locate your state's health department, and many state websites also have listings of all of their local health departments. ° °3) Find a Walk-in Clinic °If your illness is not likely to be very severe or complicated, you may want to try a walk in clinic. These are popping up all over the country in pharmacies, drugstores, and shopping centers. They're usually staffed by nurse practitioners or physician assistants that have been trained to treat common illnesses and complaints. They're usually fairly quick and inexpensive. However, if you have serious medical issues or chronic medical problems, these are probably not your best option ° °STD Testing °- [place] [facility] [place], [facility] Clinic, [address], [place], phone [phone] or 1-877-539-9860.  Monday - Friday, call for an appointment. °- [place] [facility] [place], [facility] Clinic, [address], [place], phone [phone] or 1-877-539-9860.  Monday - Friday, call for an appointment. ° °Abuse/Neglect: °- [place] Child Abuse Hotline (336) 641-3795 °- [place] Child Abuse Hotline 800-378-5315 (After Hours) ° °Emergency Shelter:  [facility] (336) 271-5985 ° °Maternity Homes: °- [facility] (336) 275-9566 °- [facility] (704) 372-4663 ° °MRSA Hotline #:   832-7006 ° °[place] Resources ° °[facility] [place]  [employer] [place] [facility] °[address]                 [address]         [address]  °[place]                                               [place]                              [place] °Phone:  349-3220                                  Phone:  342-7768                   Phone:  342-8140 ° °[facility], 342-8316 °- [place] [facility] - [facility]- 1-888-581-9988 °      -     [facility] in [place], [address],                                  336-349-4454, Insurance ° °[place] Child Abuse Hotline °(336) 342-1394 or (336) 342-3537 (After Hours) ° ° °Behavioral Health Services ° °Substance Abuse Resources: °- [facility]  336-882-2125 °- [facility] 336-784-9470 °- [facility] 336-285-9073 °- [facility] 336-845-3988 °- Residential & Outpatient Substance Abuse Program  800-659-3381 ° °Psychological Services: °- [facility]  832-9600 °- [facility]  378-7881 °- [facility], [address], [place], ACCESS LINE: 1-800-853-5163 or 336-641-4981, Http://www.[facility].com/services/adult.htm ° °Dental Assistance ° °If unable to pay or  uninsured, contact:  [facility] or [facility] to become qualified for the adult dental clinic. ° °Patients with Medicaid: [facility] °[address], [phone] °[address], [phone] ° °If unable to pay, or uninsured, contact [facility] (271-5999) or [place] [facility] (641-3152 in [place], 842-7733 in [place]) to become qualified for the adult dental clinic ° °Other Low-Cost Community Dental Services: °- [facility]- [address], [place], [place], [address], 723-1848, Ext. [address], 2nd and 4th Thursday of the month at 6:30am.  10 clients each day by appointment, can sometimes see walk-in patients if someone does not show for an appointment. °- [facility]- [address], [place], [place], [address], 723-7904 °- [facility]- [address], [place], , [address], [phone] °- [facility]- 342-8273 °- [facility]- 703-3100 °- [facility]- 570-6415 ° ° ° ° ° ° °

## 2014-08-12 NOTE — ED Notes (Signed)
Pt reports chronic back pain, 10/10 pain upon arrival to ED. Recently ran out of pain medication at home. Pt reports pain increases with movement and walking. No obvious deformities noted, denies recent injury. No signs of acute distress noted.

## 2014-12-04 ENCOUNTER — Ambulatory Visit (INDEPENDENT_AMBULATORY_CARE_PROVIDER_SITE_OTHER): Payer: Medicare Other | Admitting: Internal Medicine

## 2014-12-04 ENCOUNTER — Encounter: Payer: Self-pay | Admitting: Internal Medicine

## 2014-12-04 ENCOUNTER — Encounter (INDEPENDENT_AMBULATORY_CARE_PROVIDER_SITE_OTHER): Payer: Self-pay

## 2014-12-04 VITALS — BP 110/60 | HR 60 | Temp 97.8°F | Ht 69.5 in | Wt 150.0 lb

## 2014-12-04 DIAGNOSIS — M5416 Radiculopathy, lumbar region: Secondary | ICD-10-CM

## 2014-12-04 DIAGNOSIS — M5417 Radiculopathy, lumbosacral region: Secondary | ICD-10-CM

## 2014-12-04 DIAGNOSIS — I10 Essential (primary) hypertension: Secondary | ICD-10-CM | POA: Insufficient documentation

## 2014-12-04 DIAGNOSIS — J301 Allergic rhinitis due to pollen: Secondary | ICD-10-CM | POA: Insufficient documentation

## 2014-12-04 DIAGNOSIS — Z79891 Long term (current) use of opiate analgesic: Secondary | ICD-10-CM | POA: Diagnosis not present

## 2014-12-04 NOTE — Assessment & Plan Note (Addendum)
Chronic disability--on Medicare Slight help from oxycodone--wants me to take over and I will with limit of 3 per day. Rx due ~9/26 Will add low dose gabapentin for bedtime again Did check him on [place] controlled substance site-- #10 from ER doctor in May, otherwise scheduled Rx by Dr Ronnald Ramp and Dr Andree Elk

## 2014-12-04 NOTE — Assessment & Plan Note (Signed)
Has been okay without meds

## 2014-12-04 NOTE — Progress Notes (Signed)
Pre visit review using our clinic review tool, if applicable. No additional management support is needed unless otherwise documented below in the visit note. 

## 2014-12-04 NOTE — Progress Notes (Signed)
Subjective:    Patient ID: Jeffrey Mcbride, male    DOB: Feb 25, 1956, 59 y.o.   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 did do ESI once--multiple spots--- didn't help NCS done recently--hasn't heard of the results He can't afford to go there monthly--- wants to change to me Has been prescribed oxycodone tid--- only helps some Tizanidine does help control muscle spasm Does use ibuprofen 200 bid--does give him GI gas Aleve affected his BP  Did have elevated BP for a while Past atenolol-- was able to get it off Takes asa 81mg  every 2-3 days-discussed that he shouldn't be on this with the ibuprofen  Lots of stress Daughter lives with them- with 1 child--disabled Wife has cancer Takes care of 4 grandchildren altogether  Current Outpatient Prescriptions on File Prior to Visit  Medication Sig Dispense Refill  . oxyCODONE-acetaminophen (ROXICET) 5-325 MG per tablet Take 1 tablet by mouth every 4 (four) hours as needed for severe pain. 10 tablet 0   No current facility-administered medications on file prior to visit.    Allergies  Allergen Reactions  . Motrin [Ibuprofen] Other (See Comments)    High doses gives pt gas    Past Medical History  Diagnosis Date  . Back pain   . Lumbar back pain with radiculopathy affecting left lower extremity   . Hypertension     Past Surgical History  Procedure Laterality Date  . Back surgery    . Lumbar disc surgery  2004  . Lumbar fusion      Dr Ronnald Ramp  . Vasectomy      Family History  Problem Relation Age of Onset  . Heart disease Mother   . Hyperlipidemia Mother   . Heart disease Father   . Hyperlipidemia Father   . Alcohol abuse Sister     Social History   Social  History  . Marital Status: Married    Spouse Name: N/A  . Number of Children: 3  . Years of Education: N/A   Occupational History  . HVAC--disabled    Social History Main Topics  . Smoking status: Never Smoker   . Smokeless tobacco: Never Used  . Alcohol Use: 0.0 oz/week    0 Standard drinks or equivalent per week     Comment: "very little"  . Drug Use: No  . Sexual Activity: Not on file   Other Topics Concern  . Not on file   Social History Narrative   3 daughters   Disabled due to back problems   Caregiver for wife-- and helps with multiple grandchildren   Review of Systems  Constitutional: Negative for unexpected weight change.       Weight stable  HENT: Positive for dental problem and hearing loss.        Hoping to go to [employer] dental school to work on bad upper incisor  Respiratory: Negative for cough, chest tightness and shortness of breath.   Cardiovascular: Negative for chest pain, palpitations and leg swelling.  Gastrointestinal: Negative for nausea, vomiting and constipation.       Some gas--no regular heartburn. Chronic bleeding hemorrhoids---uses Rx cream  Musculoskeletal: Positive for back pain.       Chronic spasms too  Neurological: Positive  for weakness and numbness.       Gabapentin in past-- 300mg  was too much. 100mg  helped some  Psychiatric/Behavioral: Positive for sleep disturbance.       Pain affects his sleep Depression related to chronic pain Grieving with mom's death in February--problems with her estate Wife with cancer and chronic disability--he is caregiver       Objective:   Physical Exam  Constitutional: He appears well-developed and well-nourished. No distress.  HENT:  Mouth/Throat: Oropharynx is clear and moist. No oropharyngeal exudate.  Neck: Normal range of motion. Neck supple. No thyromegaly present.  Cardiovascular: Normal rate, regular rhythm, normal heart sounds and intact distal pulses.  Exam reveals no gallop.   No murmur  heard. Pulmonary/Chest: Effort normal and breath sounds normal. No respiratory distress. He has no wheezes. He has no rales.  Abdominal: Soft. There is no tenderness.  Musculoskeletal: He exhibits no edema.  SLR positive at maximum leg raise Very little active flexion of back (30 degrees)  Lymphadenopathy:    He has no cervical adenopathy.  Neurological:  Stiff, antalgic gait  Skin: No rash noted. No erythema.  Psychiatric:  Normal speech and appearance ?dysthymia           Assessment & Plan:

## 2014-12-05 MED ORDER — GABAPENTIN 100 MG PO CAPS: 100.0000 mg | ORAL_CAPSULE | Freq: Every day | ORAL | Status: DC

## 2014-12-05 NOTE — Addendum Note (Signed)
Addended by: Viviana Simpler I on: 12/05/2014 07:45 AM   Modules accepted: Orders

## 2014-12-20 ENCOUNTER — Encounter: Payer: Self-pay | Admitting: Internal Medicine

## 2014-12-20 ENCOUNTER — Other Ambulatory Visit: Payer: Self-pay

## 2014-12-20 MED ORDER — OXYCODONE-ACETAMINOPHEN 5-325 MG PO TABS: 1.0000 | ORAL_TABLET | Freq: Three times a day (TID) | ORAL | Status: DC | PRN

## 2014-12-20 NOTE — Telephone Encounter (Signed)
Pt left v/m requesting rx oxycodone apap. Call when ready for pick up. rx last printed #10 on 08/10/14.pt seen 12/04/14.

## 2014-12-20 NOTE — Telephone Encounter (Signed)
Left message on machine that rx is ready for pick-up, and it will be at our front desk.  

## 2014-12-20 NOTE — Telephone Encounter (Signed)
I am taking over his regular monthly Rx

## 2014-12-25 ENCOUNTER — Telehealth: Payer: Self-pay | Admitting: Internal Medicine

## 2014-12-25 NOTE — Telephone Encounter (Signed)
Spoke with [place] and this referral will be canceled, also Dr. Silvio Pate did not authorize this referral and he did not put this in.

## 2014-12-25 NOTE — Telephone Encounter (Signed)
Spoke with pt, explained that Referral had been placed by [facility] pain mgmt staff.  Dr. Silvio Pate will continue to prescribe medications to pt at this time.  Pt nor PCP need pain mgmt referral at this time.  Referral canceled.  Left message for [facility] pain mgmt staff / lt

## 2014-12-25 NOTE — Telephone Encounter (Signed)
I did not put in a referral for pain management as we decided together that I would take over his pain prescription. I don't know why they are calling He can tell them that he is not coming

## 2014-12-25 NOTE — Telephone Encounter (Signed)
Pt called and wanted to know why he needed a referral to pain management.  He says he signed Controlled Substance agreement and thought he would be getting medication here once a month.  States he is disabled and does not have lots of money for pain management.  Dr. Ethel Rana office at [facility] pain mgmt has been trying to contact him to schedule.  Pt requests call back at [phone]

## 2015-01-14 ENCOUNTER — Ambulatory Visit (INDEPENDENT_AMBULATORY_CARE_PROVIDER_SITE_OTHER)
Admission: RE | Admit: 2015-01-14 | Discharge: 2015-01-14 | Disposition: A | Discharge status: Home / Self Care | Payer: Medicare Other | Source: Ambulatory Visit | Attending: Family Medicine | Admitting: Family Medicine

## 2015-01-14 ENCOUNTER — Telehealth: Payer: Self-pay

## 2015-01-14 ENCOUNTER — Ambulatory Visit (INDEPENDENT_AMBULATORY_CARE_PROVIDER_SITE_OTHER): Payer: Medicare Other | Admitting: Family Medicine

## 2015-01-14 ENCOUNTER — Encounter: Payer: Self-pay | Admitting: Family Medicine

## 2015-01-14 ENCOUNTER — Telehealth: Payer: Self-pay | Admitting: *Deleted

## 2015-01-14 VITALS — BP 130/80 | HR 60 | Temp 98.0°F

## 2015-01-14 DIAGNOSIS — S99922A Unspecified injury of left foot, initial encounter: Secondary | ICD-10-CM

## 2015-01-14 DIAGNOSIS — S92002A Unspecified fracture of left calcaneus, initial encounter for closed fracture: Secondary | ICD-10-CM | POA: Diagnosis not present

## 2015-01-14 MED ORDER — OXYCODONE-ACETAMINOPHEN 5-325 MG PO TABS: 1.0000 | ORAL_TABLET | Freq: Four times a day (QID) | ORAL | Status: DC | PRN

## 2015-01-14 NOTE — Telephone Encounter (Signed)
[place] with [facility] left v/m to verify OK to fill oxycodone rx written 01/14/15 early; last filled # 90 on 12/24/14 with instructions one tab tid. Noted from 01/14/15 office note that change frequency to q6h prn. Is it OK to fill today?

## 2015-01-14 NOTE — Telephone Encounter (Signed)
Patient notified as instructed by telephone and verbalized understanding. 

## 2015-01-14 NOTE — Telephone Encounter (Signed)
Noted. Thanks.

## 2015-01-14 NOTE — Telephone Encounter (Signed)
Cassandra calling with results of left foot xray.  See report in EPIC.

## 2015-01-14 NOTE — Patient Instructions (Signed)
Recheck with Dr. Silvio Pate in 1 week.  We'll contact you with your xray report. Keep the splint on for now.   Crutches when upright.  Take care.  Glad to see you.

## 2015-01-14 NOTE — Telephone Encounter (Signed)
FYI- I talked to pharmacy.  rx done, okay to fill early given the foot fracture.  Thanks.

## 2015-01-14 NOTE — Progress Notes (Signed)
Pre visit review using our clinic review tool, if applicable. No additional management support is needed unless otherwise documented below in the visit note.  Event 01/12/15.  Fell awkwardly and injured L foot.  Fall was from a low height.  Couldn't land properly and felt pain immediately.  Can't bear weight.  Swollen immediately, better today.  Has been icing his foot and using his baseline oxycodone (for his back pain, now needed q6h, not q8h the last few days).  Meds, vitals, and allergies reviewed.   ROS: See HPI.  Otherwise, noncontributory.  nad ncat Talkative Mmm rrr ctab abd soft, not ttp Ext with L foot diffusely ttp and puffy with bruising noted.  Normal DP pulse.  Distally NV intact Unable to bear weight.

## 2015-01-15 NOTE — Assessment & Plan Note (Signed)
I didn't see the fracture initially.  I thought he had an occult fx anyway, so I put him in a posterior spline and he felt better.  Crutches, nonweight wearing.  Can use oxycodone q6h for now.  New rx done.  Can taper back to tid after acute pain is improved.  Will refer to ortho.  See notes on imaging.  >25 minutes spent in face to face time with patient, >50% spent in counselling or coordination of care.

## 2015-01-16 ENCOUNTER — Telehealth: Payer: Self-pay

## 2015-01-16 ENCOUNTER — Other Ambulatory Visit: Payer: Self-pay | Admitting: Orthopedic Surgery

## 2015-01-16 DIAGNOSIS — S92202A Fracture of unspecified tarsal bone(s) of left foot, initial encounter for closed fracture: Secondary | ICD-10-CM | POA: Diagnosis not present

## 2015-01-16 DIAGNOSIS — T148XXA Other injury of unspecified body region, initial encounter: Secondary | ICD-10-CM

## 2015-01-16 DIAGNOSIS — M25572 Pain in left ankle and joints of left foot: Secondary | ICD-10-CM | POA: Diagnosis not present

## 2015-01-16 NOTE — Telephone Encounter (Signed)
No he can just keep up with the orthopedist Have the ortho send me his reports---I haven't gotten anything yet

## 2015-01-16 NOTE — Telephone Encounter (Signed)
Patient left a voicemail stating that he has an appt with Dr. Silvio Pate on 01/22/15. Patient states that he was referred to orthopedic Dr. Percell Miller and that since then, he has had many tests run and office visits. He states that all of that information from these office visits should have been sent here. He is wondering that since he has had so many appts with Dr. Percell Miller, if he still needs to keep his follow up appt with Dr. Silvio Pate?

## 2015-01-16 NOTE — Telephone Encounter (Signed)
See other note

## 2015-01-16 NOTE — Telephone Encounter (Signed)
I notified patient and cancelled patient's appointment with Dr.Letvak on 01/22/15.  I left a message for Leslie,Medical Records, at Dr.Murphy's office to fax any office notes and reports.

## 2015-01-21 ENCOUNTER — Ambulatory Visit
Admission: RE | Admit: 2015-01-21 | Discharge: 2015-01-21 | Disposition: A | Discharge status: Home / Self Care | Payer: Medicare Other | Source: Ambulatory Visit | Attending: Orthopedic Surgery | Admitting: Orthopedic Surgery

## 2015-01-21 DIAGNOSIS — T148XXA Other injury of unspecified body region, initial encounter: Secondary | ICD-10-CM

## 2015-01-21 DIAGNOSIS — S92002A Unspecified fracture of left calcaneus, initial encounter for closed fracture: Secondary | ICD-10-CM | POA: Diagnosis not present

## 2015-01-22 ENCOUNTER — Ambulatory Visit: Payer: Medicare Other | Admitting: Internal Medicine

## 2015-01-25 DIAGNOSIS — S92202D Fracture of unspecified tarsal bone(s) of left foot, subsequent encounter for fracture with routine healing: Secondary | ICD-10-CM | POA: Diagnosis not present

## 2015-02-11 ENCOUNTER — Other Ambulatory Visit: Payer: Self-pay

## 2015-02-11 MED ORDER — OXYCODONE-ACETAMINOPHEN 5-325 MG PO TABS: 1.0000 | ORAL_TABLET | Freq: Four times a day (QID) | ORAL | Status: DC | PRN

## 2015-02-11 NOTE — Telephone Encounter (Signed)
Left message on machine that rx is ready for pick-up, and it will be at our front desk.  

## 2015-02-11 NOTE — Telephone Encounter (Signed)
Pt left v/m requesting rx oxycodone apap; call when ready for pick up . Pt last seen and rx printed # 120 on 01/14/15; pt has been seeing ortho, Dr Percell Miller and has lt leg casted.Please advise.

## 2015-02-13 ENCOUNTER — Other Ambulatory Visit: Payer: Self-pay | Admitting: *Deleted

## 2015-02-13 MED ORDER — TIZANIDINE HCL 2 MG PO TABS: 2.0000 mg | ORAL_TABLET | Freq: Two times a day (BID) | ORAL | Status: DC

## 2015-02-25 DIAGNOSIS — S92202D Fracture of unspecified tarsal bone(s) of left foot, subsequent encounter for fracture with routine healing: Secondary | ICD-10-CM | POA: Diagnosis not present

## 2015-03-11 ENCOUNTER — Other Ambulatory Visit: Payer: Self-pay

## 2015-03-11 MED ORDER — TIZANIDINE HCL 2 MG PO TABS: 2.0000 mg | ORAL_TABLET | Freq: Two times a day (BID) | ORAL | Status: DC

## 2015-03-11 MED ORDER — OXYCODONE-ACETAMINOPHEN 5-325 MG PO TABS: 1.0000 | ORAL_TABLET | Freq: Four times a day (QID) | ORAL | Status: DC | PRN

## 2015-03-11 NOTE — Telephone Encounter (Signed)
Spoke with patient and advised rx ready for pick-up and it will be at the front desk.  

## 2015-03-11 NOTE — Telephone Encounter (Signed)
Pt left v/m requesting rx oxycodone apap(last printed # 120 on 02/11/15). Call when ready for pick up; request refill tizanidine(last refill #60 on 02/13/15) to [facility]. Pt established care on 12/04/14.

## 2015-04-09 ENCOUNTER — Other Ambulatory Visit: Payer: Self-pay

## 2015-04-09 NOTE — Telephone Encounter (Signed)
Pt left v/m requesting rx oxycodone apap (last printed # 120 on 03/11/15) and refill [facility]idine [facility] to [facility](last refilled # 60 on 03/11/15), pt established care with Dr Silvio Pate 12/04/14.

## 2015-04-10 MED ORDER — TIZANIDINE HCL 2 MG PO TABS: 2.0000 mg | ORAL_TABLET | Freq: Two times a day (BID) | ORAL | Status: DC

## 2015-04-10 MED ORDER — OXYCODONE-ACETAMINOPHEN 5-325 MG PO TABS: 1.0000 | ORAL_TABLET | Freq: Four times a day (QID) | ORAL | Status: DC | PRN

## 2015-04-10 NOTE — Telephone Encounter (Signed)
Left message on machine that rx is ready for pick-up, and it will be at our front desk.  

## 2015-05-09 ENCOUNTER — Other Ambulatory Visit: Payer: Self-pay

## 2015-05-09 MED ORDER — TIZANIDINE HCL 2 MG PO TABS: 2.0000 mg | ORAL_TABLET | Freq: Two times a day (BID) | ORAL | Status: DC

## 2015-05-09 MED ORDER — OXYCODONE-ACETAMINOPHEN 5-325 MG PO TABS: 1.0000 | ORAL_TABLET | Freq: Four times a day (QID) | ORAL | Status: DC | PRN

## 2015-05-09 NOTE — Telephone Encounter (Signed)
Pt left v/m requesting refill tizanidine(last refilled # 60 on 04/10/15) to [facility] and rx oxycodone apap(last printed # 120 on 04/10/15). Call when ready for pick up. Pt established care 12/04/14 and seen 01/14/15 for foot pain.

## 2015-05-09 NOTE — Telephone Encounter (Signed)
Spoke with patient and advised results, he has appt 06/03/2015 and he will discuss at the [facility]

## 2015-05-09 NOTE — Telephone Encounter (Signed)
Let him know since it is months since the foot fracture--we will be going back to the #90 per month. He should set up follow up in the next 1-2 months

## 2015-06-03 ENCOUNTER — Ambulatory Visit (INDEPENDENT_AMBULATORY_CARE_PROVIDER_SITE_OTHER): Payer: Medicare Other | Admitting: Internal Medicine

## 2015-06-03 ENCOUNTER — Encounter: Payer: Self-pay | Admitting: Internal Medicine

## 2015-06-03 VITALS — BP 130/80 | HR 58 | Temp 97.9°F | Wt 172.0 lb

## 2015-06-03 DIAGNOSIS — M5417 Radiculopathy, lumbosacral region: Secondary | ICD-10-CM

## 2015-06-03 DIAGNOSIS — S92002D Unspecified fracture of left calcaneus, subsequent encounter for fracture with routine healing: Secondary | ICD-10-CM | POA: Diagnosis not present

## 2015-06-03 DIAGNOSIS — M5416 Radiculopathy, lumbar region: Secondary | ICD-10-CM

## 2015-06-03 MED ORDER — OXYCODONE-ACETAMINOPHEN 5-325 MG PO TABS: 1.0000 | ORAL_TABLET | Freq: Four times a day (QID) | ORAL | Status: DC | PRN

## 2015-06-03 MED ORDER — GABAPENTIN 100 MG PO CAPS: 100.0000 mg | ORAL_CAPSULE | Freq: Three times a day (TID) | ORAL | Status: DC

## 2015-06-03 NOTE — Assessment & Plan Note (Signed)
Ongoing pain Will continue meds---oxycodone Increase gabapentin-- can be up to 3 a day

## 2015-06-03 NOTE — Progress Notes (Signed)
Pre visit review using our clinic review tool, if applicable. No additional management support is needed unless otherwise documented below in the visit note. 

## 2015-06-03 NOTE — Assessment & Plan Note (Signed)
Better but still hurts after being on it for a while and mild swelling No Rx needed

## 2015-06-03 NOTE — Progress Notes (Signed)
   Subjective:    Patient ID: Jeffrey Mcbride, male    DOB: 1956-03-01, 60 y.o.   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 gabapentin (NEURONTIN) 100 MG capsule Take 1-2 capsules (100-200 mg total) by mouth at bedtime. 60 capsule 11  . oxyCODONE-acetaminophen (ROXICET) 5-325 MG tablet Take 1 tablet by mouth every 6 (six) hours as needed for severe pain. 90 tablet 0  . tiZANidine (ZANAFLEX) 2 MG tablet Take 1 tablet (2 mg total) by mouth 2 (two) times daily. 60 tablet 3   No current facility-administered medications on file prior to visit.    Allergies  Allergen Reactions  . Motrin [Ibuprofen] Other (See Comments)    High doses gives pt gas    Past Medical History  Diagnosis Date  . Back pain   . Lumbar back pain with radiculopathy affecting left lower extremity   . Hypertension   . Allergic rhinitis due to pollen     Past Surgical History  Procedure Laterality Date  . Back surgery    . Lumbar disc surgery  2004  . Lumbar fusion      Dr Ronnald Ramp  . Vasectomy      Family History  Problem Relation Age of Onset  . Heart disease Mother   . Hyperlipidemia Mother   . Heart disease Father   . Hyperlipidemia Father   . Alcohol abuse Sister     Social History   Social History  . Marital Status: Married    Spouse Name: N/A  . Number of Children: 3  . Years of Education: N/A   Occupational History  . HVAC--disabled    Social History Main Topics  . Smoking status: Never Smoker   . Smokeless tobacco: Never Used  .  Alcohol Use: 0.0 oz/week    0 Standard drinks or equivalent per week     Comment: "very little"  . Drug Use: No  . Sexual Activity: Not on file   Other Topics Concern  . Not on file   Social History Narrative   3 daughters   Disabled due to back problems   Caregiver for wife-- and helps with multiple grandchildren   Review of Systems  Appetite is okay Weight is back up again--- "poisoned" tooth came out (also wearing heavy boots today)     Objective:   Physical Exam  Constitutional: He appears well-developed and well-nourished. No distress.  Musculoskeletal:  Puffiness in left ankle but no tenderness  Psychiatric: He has a normal mood and affect. His behavior is normal.          Assessment & Plan:

## 2015-06-26 ENCOUNTER — Other Ambulatory Visit: Payer: Self-pay

## 2015-06-26 NOTE — Telephone Encounter (Signed)
Pt left v/m requesting rx oxycodone apap. Call when ready for pick up. Pt last seen and rx last printed # 90 on 06/03/15.

## 2015-06-26 NOTE — Telephone Encounter (Signed)
Spoke to patient. Advised him we will not do a refill until Monday.

## 2015-06-26 NOTE — Telephone Encounter (Signed)
He is a week early I can fill it next Monday

## 2015-07-01 MED ORDER — OXYCODONE-ACETAMINOPHEN 5-325 MG PO TABS: 1.0000 | ORAL_TABLET | Freq: Four times a day (QID) | ORAL | Status: DC | PRN

## 2015-07-01 NOTE — Telephone Encounter (Signed)
Last RX 06-03-15 Last OV 06-03-15 Next [id number]

## 2015-07-01 NOTE — Telephone Encounter (Signed)
Spoke to patient. Rx is ready to pick up up front.

## 2015-07-01 NOTE — Telephone Encounter (Signed)
Patient called to check on prescription.

## 2015-07-29 ENCOUNTER — Other Ambulatory Visit: Payer: Self-pay

## 2015-07-29 MED ORDER — OXYCODONE-ACETAMINOPHEN 5-325 MG PO TABS: 1.0000 | ORAL_TABLET | Freq: Four times a day (QID) | ORAL | Status: DC | PRN

## 2015-07-29 NOTE — Telephone Encounter (Signed)
Left message on voice mail that rx was ready for pickup

## 2015-07-29 NOTE — Telephone Encounter (Signed)
Pt left v/m requesting rx endocet; oxycodone apap last filled # 90 on 07/01/15. Last seen 06/03/15. Call when ready for pick up.

## 2015-08-28 ENCOUNTER — Telehealth: Payer: Self-pay | Admitting: Internal Medicine

## 2015-08-28 MED ORDER — TIZANIDINE HCL 2 MG PO TABS: 2.0000 mg | ORAL_TABLET | Freq: Two times a day (BID) | ORAL | Status: DC

## 2015-08-28 MED ORDER — OXYCODONE-ACETAMINOPHEN 5-325 MG PO TABS: 1.0000 | ORAL_TABLET | Freq: Four times a day (QID) | ORAL | Status: DC | PRN

## 2015-08-28 NOTE — Telephone Encounter (Signed)
Spoke to pt. Tizanidine was sent to the pharmacy. Oxycodone is up front for pickup

## 2015-08-28 NOTE — Telephone Encounter (Signed)
Pt left v/m requesting rx endocet; oxycodone apap last filled # 90 on 07/29/15. Also request ing tiZANidine (ZANAFLEX) 2 MG tablet, last refilled on 05/09/15 #60 with 3 refills.  Last seen 06/03/15. Call when ready for pick up.  [phone]

## 2015-09-25 ENCOUNTER — Other Ambulatory Visit: Payer: Self-pay

## 2015-09-25 MED ORDER — OXYCODONE-ACETAMINOPHEN 5-325 MG PO TABS: 1.0000 | ORAL_TABLET | Freq: Four times a day (QID) | ORAL | Status: DC | PRN

## 2015-09-25 NOTE — Telephone Encounter (Signed)
Pt left v/m requesting rx oxycodone apap. Call when ready for pick up. Last printed # 90 on 08/28/15. Pt last seen 06/03/15.

## 2015-09-25 NOTE — Telephone Encounter (Signed)
Spoke to pt. Rx up front ready for pickup 

## 2015-10-24 ENCOUNTER — Other Ambulatory Visit: Payer: Self-pay

## 2015-10-24 MED ORDER — OXYCODONE-ACETAMINOPHEN 5-325 MG PO TABS: 1.0000 | ORAL_TABLET | Freq: Four times a day (QID) | ORAL | 0 refills | Status: DC | PRN

## 2015-10-24 NOTE — Telephone Encounter (Signed)
Pt left v/m requesting rx oxycodone apap. Call when ready for pick up. Last printed # 90 on 09/25/15. Last seen 06/03/15. Dr Silvio Pate out of office until 10/28/15.Please advise.

## 2015-10-24 NOTE — Telephone Encounter (Signed)
Left message on VM per DPR that rx was up front ready for pickup

## 2015-10-24 NOTE — Telephone Encounter (Signed)
RX printed and signed, given to [employer]

## 2015-11-22 ENCOUNTER — Other Ambulatory Visit: Payer: Self-pay

## 2015-11-22 NOTE — Telephone Encounter (Signed)
Pt left /vm requesting rx for oxycodone apap. Call when ready for pick up. Pt wants to pick up on08/28/17. Last printed # 90 on 10/24/15. Pt last seen 06/03/15.

## 2015-11-23 NOTE — Telephone Encounter (Signed)
Approved: okay to prepare #90 x 0 

## 2015-11-25 MED ORDER — OXYCODONE-ACETAMINOPHEN 5-325 MG PO TABS: 1.0000 | ORAL_TABLET | Freq: Four times a day (QID) | ORAL | 0 refills | Status: DC | PRN

## 2015-11-25 NOTE — Telephone Encounter (Signed)
Rx printed and waiting for signature 

## 2015-11-25 NOTE — Telephone Encounter (Signed)
Spoke to pt. Rx up front ready for pickup 

## 2015-12-04 ENCOUNTER — Encounter: Payer: Self-pay | Admitting: Internal Medicine

## 2015-12-04 ENCOUNTER — Ambulatory Visit (INDEPENDENT_AMBULATORY_CARE_PROVIDER_SITE_OTHER): Payer: Medicare Other | Admitting: Internal Medicine

## 2015-12-04 DIAGNOSIS — M5417 Radiculopathy, lumbosacral region: Secondary | ICD-10-CM

## 2015-12-04 DIAGNOSIS — M5416 Radiculopathy, lumbar region: Secondary | ICD-10-CM

## 2015-12-04 NOTE — Progress Notes (Signed)
Pre visit review using our clinic review tool, if applicable. No additional management support is needed unless otherwise documented below in the visit note. 

## 2015-12-04 NOTE — Assessment & Plan Note (Signed)
Ongoing pain Disability due to this He tries to stay active Checked CSRS--no other prescribers

## 2015-12-04 NOTE — Progress Notes (Signed)
   Subjective:    Patient ID: Jeffrey Mcbride, male    DOB: 1955/09/09, 60 y.o.   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 acetaminophen (TYLENOL) 500 MG tablet Take 250 mg by mouth every 6 (six) hours as needed.    . gabapentin (NEURONTIN) 100 MG capsule Take 1 capsule (100 mg total) by mouth 3 (three) times daily. 90 capsule 11  . ibuprofen (ADVIL,MOTRIN) 200 MG tablet Take 200 mg by mouth 3 (three) times daily.    Marland Kitchen oxyCODONE-acetaminophen (ROXICET) 5-325 MG tablet Take 1 tablet by mouth every 6 (six) hours as needed for severe pain. 90 tablet 0  . tiZANidine (ZANAFLEX) 2 MG tablet Take 1 tablet (2 mg total) by mouth 2 (two) times daily. 60 tablet 3   No current facility-administered medications on file prior to visit.     Allergies  Allergen Reactions  . Motrin [Ibuprofen] Other (See Comments)    High doses gives pt gas    Past Medical History:  Diagnosis Date  . Allergic rhinitis due to pollen   . Back pain   . Hypertension   . Lumbar back pain with radiculopathy affecting left lower extremity     Past Surgical History:  Procedure Laterality Date  . BACK SURGERY    . [place] SURGERY  2004  . LUMBAR FUSION     Dr Ronnald Ramp  . VASECTOMY      Family History  Problem Relation Age of  Onset  . Heart disease Mother   . Hyperlipidemia Mother   . Heart disease Father   . Hyperlipidemia Father   . Alcohol abuse Sister     Social History   Social History  . Marital status: Married    Spouse name: N/A  . Number of children: 3  . Years of education: N/A   Occupational History  . HVAC--disabled    Social History Main Topics  . Smoking status: Never Smoker  . Smokeless tobacco: Never Used  . Alcohol use 0.0 oz/week     Comment: "very little"  . Drug use: No  . Sexual activity: Not on file   Other Topics Concern  . Not on file   Social History Narrative   3 daughters   Disabled due to back problems   Caregiver for wife-- and helps with multiple grandchildren   Review of Systems Lost 10# with increased activity Sleep is still not great--still uses the gabapentin    Objective:   Physical Exam  Musculoskeletal:  Left ankle shows no swelling or sig tenderness Tenderness along lumbar spine  Neurological:  Slow but normal gait           Assessment & Plan:

## 2015-12-23 ENCOUNTER — Other Ambulatory Visit: Payer: Self-pay

## 2015-12-23 MED ORDER — OXYCODONE-ACETAMINOPHEN 5-325 MG PO TABS: 1.0000 | ORAL_TABLET | Freq: Three times a day (TID) | ORAL | 0 refills | Status: DC | PRN

## 2015-12-23 NOTE — Telephone Encounter (Signed)
Pt left v/m requesting rx oxycodone apap. Call when ready for pick up. rx last printed # 90 on 11/25/15 last seen 12/04/15. Dr Silvio Pate out of office.Please advise.

## 2015-12-23 NOTE — Telephone Encounter (Signed)
Left message on VM per DPR that rx is up front ready for pickup 

## 2016-01-21 ENCOUNTER — Other Ambulatory Visit: Payer: Self-pay

## 2016-01-21 MED ORDER — OXYCODONE-ACETAMINOPHEN 5-325 MG PO TABS
1.0000 | ORAL_TABLET | Freq: Three times a day (TID) | ORAL | 0 refills | Status: DC | PRN
Start: 2016-01-21 — End: 2016-02-24

## 2016-01-21 NOTE — Telephone Encounter (Signed)
Left message on VM per DPR that RX is up front ready for pickup

## 2016-01-21 NOTE — Telephone Encounter (Signed)
Pt left v/m requesting refill oxycodone apap. Call when ready for pick up. Last printed # 90 on 12/23/15. Last seen 12/04/15.

## 2016-02-24 ENCOUNTER — Other Ambulatory Visit: Payer: Self-pay

## 2016-02-24 NOTE — Telephone Encounter (Signed)
Pt left v/m requesting rx for oxycodone apap. Call when ready for pick up. rx last printed #90 on 01/21/16; last seen 12/04/15.

## 2016-02-25 MED ORDER — OXYCODONE-ACETAMINOPHEN 5-325 MG PO TABS: 1.0000 | ORAL_TABLET | Freq: Three times a day (TID) | ORAL | 0 refills | Status: DC | PRN

## 2016-02-25 NOTE — Telephone Encounter (Signed)
Jeffrey Mcbride made pt aware rx is up front ready for pickup

## 2016-03-24 ENCOUNTER — Other Ambulatory Visit: Payer: Self-pay

## 2016-03-24 MED ORDER — OXYCODONE-ACETAMINOPHEN 5-325 MG PO TABS: 1.0000 | ORAL_TABLET | Freq: Three times a day (TID) | ORAL | 0 refills | Status: DC | PRN

## 2016-03-24 NOTE — Telephone Encounter (Signed)
Pt left v/m requesting rx oxycodone apap. Call when ready for pick up. Pt last seen 12/04/15 and last printed # 90 on 02/25/16.

## 2016-03-24 NOTE — Telephone Encounter (Signed)
Tried to call pt. VM not set up.  

## 2016-03-24 NOTE — Telephone Encounter (Signed)
Rx up front ready for pick up

## 2016-03-25 NOTE — Telephone Encounter (Signed)
Tried to call pt, again. Still no VM set up

## 2016-04-21 ENCOUNTER — Other Ambulatory Visit: Payer: Self-pay

## 2016-04-21 NOTE — Telephone Encounter (Signed)
Pt request rx oxycodone apap. Call when ready for pick up. Last seen 12/04/15. Last printed # 90 on 03/24/16.

## 2016-04-22 MED ORDER — OXYCODONE-ACETAMINOPHEN 5-325 MG PO TABS: 1.0000 | ORAL_TABLET | Freq: Three times a day (TID) | ORAL | 0 refills | Status: DC | PRN

## 2016-04-22 NOTE — Telephone Encounter (Signed)
Tried to call pt but no answer and VM not set up. Rx up front ready for pickup

## 2016-05-26 ENCOUNTER — Other Ambulatory Visit: Payer: Self-pay

## 2016-05-26 MED ORDER — OXYCODONE-ACETAMINOPHEN 5-325 MG PO TABS
1.0000 | ORAL_TABLET | Freq: Three times a day (TID) | ORAL | 0 refills | Status: DC | PRN
Start: 2016-05-26 — End: 2016-06-23

## 2016-05-26 NOTE — Telephone Encounter (Signed)
Pt left v/m requesting rx oxycodone apap. Call when ready for pick up. Last printed # 90 on 04/22/16, last seen 12/14/15.

## 2016-05-26 NOTE — Telephone Encounter (Signed)
Called pt, VM not set up---Rx placed in the front office for pick up

## 2016-06-15 ENCOUNTER — Encounter: Payer: Medicare Other | Admitting: Internal Medicine

## 2016-06-23 ENCOUNTER — Other Ambulatory Visit: Payer: Self-pay | Admitting: *Deleted

## 2016-06-23 MED ORDER — OXYCODONE-ACETAMINOPHEN 5-325 MG PO TABS: 1.0000 | ORAL_TABLET | Freq: Three times a day (TID) | ORAL | 0 refills | Status: DC | PRN

## 2016-06-23 NOTE — Telephone Encounter (Signed)
Pt requesting medication refill. Last Rx 02/272018. Last OV 11/2015. He states his vm is not set up to leave messages but he will see the missed call and pickup Rx

## 2016-06-23 NOTE — Telephone Encounter (Signed)
Rx up front ready for pickup. Tried to call pt. No answer

## 2016-07-07 ENCOUNTER — Other Ambulatory Visit: Payer: Medicare Other

## 2016-07-07 ENCOUNTER — Encounter: Payer: Self-pay | Admitting: Internal Medicine

## 2016-07-07 ENCOUNTER — Ambulatory Visit (INDEPENDENT_AMBULATORY_CARE_PROVIDER_SITE_OTHER): Payer: Medicare Other | Admitting: Internal Medicine

## 2016-07-07 VITALS — BP 134/76 | HR 67 | Temp 98.4°F | Wt 158.0 lb

## 2016-07-07 DIAGNOSIS — M5416 Radiculopathy, lumbar region: Secondary | ICD-10-CM

## 2016-07-07 DIAGNOSIS — M5417 Radiculopathy, lumbosacral region: Secondary | ICD-10-CM | POA: Diagnosis not present

## 2016-07-07 DIAGNOSIS — J0110 Acute frontal sinusitis, unspecified: Secondary | ICD-10-CM

## 2016-07-07 MED ORDER — AMOXICILLIN 500 MG PO TABS: 1000.0000 mg | ORAL_TABLET | Freq: Two times a day (BID) | ORAL | 0 refills | Status: AC

## 2016-07-07 NOTE — Assessment & Plan Note (Signed)
Trying to cut back on the oxycodone

## 2016-07-07 NOTE — Progress Notes (Signed)
Pre visit review using our clinic review tool, if applicable. No additional management support is needed unless otherwise documented below in the visit note. 

## 2016-07-07 NOTE — Assessment & Plan Note (Signed)
Probably secondary to allergy issues Discussed antihistamines Will treat with amoxil

## 2016-07-07 NOTE — Patient Instructions (Signed)
Please try daily fexofenadine 180mg  or cetirizine 10mg  daily for the allergies.

## 2016-07-07 NOTE — Progress Notes (Signed)
Subjective:    Patient ID: Jeffrey Mcbride, male    DOB: [birth year], 61 y.o.   MRN: [id number]  HPI Here due to respiratory illness  Has been having sinus symptoms for 2 weeks Coughing up green stuff Ears are full and congested Feels it in his right chest No fever, sweats or chills Some scratchy throat--still intermittent No SOB--still doing his walking  Was sick 2 months ago--but did finally get over that  Using benedryl (helps but side effects) and cough syrup Thinks he has had some allergy symptoms  Feels his kidneys were hurting due to the tizanidine Trying to use less of the oxycodone--hopes to get down to once a day Some foot tingling and throbbing  Current Outpatient Prescriptions on File Prior to Visit  Medication Sig Dispense Refill  . acetaminophen (TYLENOL) 500 MG tablet Take 250 mg by mouth every 6 (six) hours as needed.    . gabapentin (NEURONTIN) 100 MG capsule Take 1 capsule (100 mg total) by mouth 3 (three) times daily. 90 capsule 11  . ibuprofen (ADVIL,MOTRIN) 200 MG tablet Take 200 mg by mouth 3 (three) times daily.    Marland Kitchen oxyCODONE-acetaminophen (ROXICET) 5-325 MG tablet Take 1 tablet by mouth every 8 (eight) hours as needed for severe pain. To be filled on or after 03/26/16 90 tablet 0  . tiZANidine (ZANAFLEX) 2 MG tablet Take 1 tablet (2 mg total) by mouth 2 (two) times daily. 60 tablet 3   No current facility-administered medications on file prior to visit.     Allergies  Allergen Reactions  . Motrin [Ibuprofen] Other (See Comments)    High doses gives pt gas    Past Medical History:  Diagnosis Date  . Allergic rhinitis due to pollen   . Back pain   . Hypertension   . Lumbar back pain with radiculopathy affecting left lower extremity     Past Surgical History:  Procedure Laterality Date  . BACK SURGERY    . [place] SURGERY  2004  . LUMBAR FUSION     Dr Ronnald Ramp  . VASECTOMY      Family History  Problem Relation Age of Onset  .  Heart disease Mother   . Hyperlipidemia Mother   . Heart disease Father   . Hyperlipidemia Father   . Alcohol abuse Sister     Social History   Social History  . Marital status: Married    Spouse name: N/A  . Number of children: 3  . Years of education: N/A   Occupational History  . HVAC--disabled    Social History Main Topics  . Smoking status: Never Smoker  . Smokeless tobacco: Never Used  . Alcohol use 0.0 oz/week     Comment: "very little"  . Drug use: No  . Sexual activity: Not on file   Other Topics Concern  . Not on file   Social History Narrative   3 daughters   Disabled due to back problems   Caregiver for wife-- and helps with multiple grandchildren   Review of Systems Has started walking-- at [facility] Left foot is doing some better from past fracture    Objective:   Physical Exam  Constitutional: No distress.  HENT:  No clear sinus tenderness Moderate nasal inflammation TMs normal ?slight pharyngeal injection  Neck: No thyromegaly present.  Pulmonary/Chest: Effort normal and breath sounds normal. No respiratory distress. He has no wheezes. He has no rales.  Lymphadenopathy:    He has no cervical  adenopathy.          Assessment & Plan:

## 2016-07-10 LAB — TOXASSURE SELECT 13 (MW), URINE

## 2016-08-11 ENCOUNTER — Ambulatory Visit (INDEPENDENT_AMBULATORY_CARE_PROVIDER_SITE_OTHER): Payer: Medicare Other | Admitting: Internal Medicine

## 2016-08-11 ENCOUNTER — Encounter: Payer: Self-pay | Admitting: Internal Medicine

## 2016-08-11 VITALS — BP 144/88 | HR 46 | Temp 97.8°F | Ht 68.0 in | Wt 157.0 lb

## 2016-08-11 DIAGNOSIS — J301 Allergic rhinitis due to pollen: Secondary | ICD-10-CM | POA: Diagnosis not present

## 2016-08-11 DIAGNOSIS — M5417 Radiculopathy, lumbosacral region: Secondary | ICD-10-CM | POA: Diagnosis not present

## 2016-08-11 DIAGNOSIS — Z7189 Other specified counseling: Secondary | ICD-10-CM | POA: Diagnosis not present

## 2016-08-11 DIAGNOSIS — Z1211 Encounter for screening for malignant neoplasm of colon: Secondary | ICD-10-CM | POA: Diagnosis not present

## 2016-08-11 DIAGNOSIS — M5416 Radiculopathy, lumbar region: Secondary | ICD-10-CM

## 2016-08-11 DIAGNOSIS — I10 Essential (primary) hypertension: Secondary | ICD-10-CM | POA: Diagnosis not present

## 2016-08-11 DIAGNOSIS — Z0000 Encounter for general adult medical examination without abnormal findings: Secondary | ICD-10-CM

## 2016-08-11 LAB — LIPID PANEL
Cholesterol: 211 mg/dL — ABNORMAL HIGH (ref 0–200)
HDL: 44.3 mg/dL (ref >39.00)
LDL Cholesterol: 146 mg/dL — ABNORMAL HIGH (ref 0–99)
NONHDL: 166.52
TRIGLYCERIDES: 102 mg/dL (ref 0.0–149.0)
Total CHOL/HDL Ratio: 5
VLDL: 20.4 mg/dL (ref 0.0–40.0)

## 2016-08-11 LAB — CBC WITH DIFFERENTIAL/PLATELET
BASOS ABS: 0.1 10*3/uL (ref 0.0–0.1)
Basophils Relative: 1 % (ref 0.0–3.0)
Eosinophils Absolute: 0.8 10*3/uL — ABNORMAL HIGH (ref 0.0–0.7)
Eosinophils Relative: 9.8 % — ABNORMAL HIGH (ref 0.0–5.0)
HEMATOCRIT: 42.4 % (ref 39.0–52.0)
Hemoglobin: 14.4 g/dL (ref 13.0–17.0)
LYMPHS ABS: 2.1 10*3/uL (ref 0.7–4.0)
LYMPHS PCT: 25 % (ref 12.0–46.0)
MCHC: 33.9 g/dL (ref 30.0–36.0)
MCV: 90.3 fl (ref 78.0–100.0)
MONOS PCT: 6.6 % (ref 3.0–12.0)
Monocytes Absolute: 0.6 10*3/uL (ref 0.1–1.0)
NEUTROS PCT: 57.6 % (ref 43.0–77.0)
Neutro Abs: 4.9 10*3/uL (ref 1.4–7.7)
Platelets: 265 10*3/uL (ref 150.0–400.0)
RBC: 4.7 Mil/uL (ref 4.22–5.81)
RDW: 13.7 % (ref 11.5–15.5)
WBC: 8.5 10*3/uL (ref 4.0–10.5)

## 2016-08-11 LAB — COMPREHENSIVE METABOLIC PANEL
ALK PHOS: 85 U/L (ref 39–117)
ALT: 19 U/L (ref 0–53)
AST: 19 U/L (ref 0–37)
Albumin: 4.6 g/dL (ref 3.5–5.2)
BILIRUBIN TOTAL: 0.4 mg/dL (ref 0.2–1.2)
BUN: 14 mg/dL (ref 6–23)
CALCIUM: 9.6 mg/dL (ref 8.4–10.5)
CO2: 29 mEq/L (ref 19–32)
Chloride: 104 mEq/L (ref 96–112)
Creatinine, Ser: 1.26 mg/dL (ref 0.40–1.50)
GFR: 61.5 mL/min (ref >60.00)
Glucose, Bld: 97 mg/dL (ref 70–99)
Potassium: 3.9 mEq/L (ref 3.5–5.1)
Sodium: 139 mEq/L (ref 135–145)
TOTAL PROTEIN: 7.4 g/dL (ref 6.0–8.3)

## 2016-08-11 NOTE — Assessment & Plan Note (Signed)
See social history Blank forms given 

## 2016-08-11 NOTE — Assessment & Plan Note (Signed)
BP Readings from Last 3 Encounters:  08/11/16 (!) 144/88  07/07/16 134/76  12/04/15 136/86   Up slightly but seems to be due to anxiety (about the pot in UDS) No meds for now--did poorly with them in the past

## 2016-08-11 NOTE — Assessment & Plan Note (Signed)
Trying to cut back on the percocet CSRS shows nothing but from here UDS last month Wants to hold off on refill today

## 2016-08-11 NOTE — Assessment & Plan Note (Signed)
Does fine with OTC med
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
## 2016-08-11 NOTE — Assessment & Plan Note (Signed)
I have personally reviewed the Medicare Annual Wellness questionnaire and have noted 1. The patient's medical and social history 2. Their use of alcohol, tobacco or illicit drugs 3. Their current medications and supplements 4. The patient's functional ability including ADL's, fall risks, home safety risks and hearing or visual             impairment. 5. Diet and physical activities 6. Evidence for depression or mood disorders  The patients weight, height, BMI and visual acuity have been recorded in the chart I have made referrals, counseling and provided education to the patient based review of the above and I have provided the pt with a written personalized care plan for preventive services.  I have provided you with a copy of your personalized plan for preventive services. Please take the time to review along with your updated medication list.  Doesn't want flu vaccines Will do colonoscopy Discussed PSA-- will hold off

## 2016-08-28 ENCOUNTER — Other Ambulatory Visit: Payer: Self-pay

## 2016-08-28 MED ORDER — OXYCODONE-ACETAMINOPHEN 5-325 MG PO TABS: 1.0000 | ORAL_TABLET | Freq: Three times a day (TID) | ORAL | 0 refills | Status: DC | PRN

## 2016-08-28 NOTE — Telephone Encounter (Signed)
Spoke to pt. Rx up front ready for pickup 

## 2016-08-28 NOTE — Telephone Encounter (Signed)
Pt left v/m requesting rx oxycodone apap. Call when ready for pick up. rx last printed # 90 on 06/23/16; pt last seen annual 08/11/16.

## 2016-09-28 ENCOUNTER — Other Ambulatory Visit: Payer: Self-pay

## 2016-09-28 NOTE — Telephone Encounter (Signed)
Approved: okay to prepare #90 x 0

## 2016-09-28 NOTE — Telephone Encounter (Signed)
Pt left v/m requesting rx oxycodone apap. Call when ready for pick up. Last printed # 90 on 08/28/16 and last annual 08/11/16.

## 2016-09-29 ENCOUNTER — Ambulatory Visit (AMBULATORY_SURGERY_CENTER): Payer: Self-pay | Admitting: *Deleted

## 2016-09-29 VITALS — Ht 68.0 in | Wt 158.2 lb

## 2016-09-29 DIAGNOSIS — Z1211 Encounter for screening for malignant neoplasm of colon: Secondary | ICD-10-CM

## 2016-09-29 MED ORDER — NA SULFATE-K SULFATE-MG SULF 17.5-3.13-1.6 GM/177ML PO SOLN: 1.0000 | Freq: Once | ORAL | 0 refills | Status: AC

## 2016-09-29 MED ORDER — OXYCODONE-ACETAMINOPHEN 5-325 MG PO TABS
1.0000 | ORAL_TABLET | Freq: Three times a day (TID) | ORAL | 0 refills | Status: DC | PRN
Start: 2016-09-29 — End: 2016-10-29

## 2016-09-29 NOTE — Progress Notes (Signed)
Denies allergies to eggs or soy products. Denies complications with sedation or anesthesia. Denies O2 use. Denies use of diet or weight loss medications.  Emmi instructions given for colonoscopy.  

## 2016-09-29 NOTE — Telephone Encounter (Signed)
Tried to call pt but VM is not set up. Rx is up front ready for pickup.

## 2016-09-29 NOTE — Telephone Encounter (Signed)
Rx printed and waiting for signature 

## 2016-10-05 ENCOUNTER — Encounter: Payer: Self-pay | Admitting: Internal Medicine

## 2016-10-13 ENCOUNTER — Ambulatory Visit (AMBULATORY_SURGERY_CENTER): Payer: Medicare Other | Admitting: Internal Medicine

## 2016-10-13 ENCOUNTER — Encounter: Payer: Self-pay | Admitting: Internal Medicine

## 2016-10-13 VITALS — BP 156/78 | HR 42 | Temp 97.3°F | Resp 17 | Ht 68.0 in | Wt 158.0 lb

## 2016-10-13 DIAGNOSIS — D123 Benign neoplasm of transverse colon: Secondary | ICD-10-CM

## 2016-10-13 DIAGNOSIS — D122 Benign neoplasm of ascending colon: Secondary | ICD-10-CM | POA: Diagnosis not present

## 2016-10-13 DIAGNOSIS — D127 Benign neoplasm of rectosigmoid junction: Secondary | ICD-10-CM | POA: Diagnosis not present

## 2016-10-13 DIAGNOSIS — Z1212 Encounter for screening for malignant neoplasm of rectum: Secondary | ICD-10-CM

## 2016-10-13 DIAGNOSIS — Z1211 Encounter for screening for malignant neoplasm of colon: Secondary | ICD-10-CM

## 2016-10-13 DIAGNOSIS — K635 Polyp of colon: Secondary | ICD-10-CM

## 2016-10-13 MED ORDER — SODIUM CHLORIDE 0.9 % IV SOLN
500.0000 mL | INTRAVENOUS | Status: DC
Start: 2016-10-13 — End: 2016-11-18

## 2016-10-13 NOTE — Progress Notes (Signed)
Patient awakening,vss,report to rn 

## 2016-10-13 NOTE — Op Note (Signed)
[facility] Patient Name: Jeffrey Mcbride Procedure Date: 10/13/2016 10:06 AM MRN: [id number] Endoscopist: Jerene Bears , MD Age: 61 Referring MD:  Date of Birth: May 13, 1955 Gender: Male Account #: [id number] Procedure:                Colonoscopy Indications:              Screening for colorectal malignant neoplasm, This                            is the patient's first colonoscopy Medicines:                Monitored Anesthesia Care Procedure:                Pre-Anesthesia Assessment:                           - Prior to the procedure, a History and Physical                            was performed, and patient medications and                            allergies were reviewed. The patient's tolerance of                            previous anesthesia was also reviewed. The risks                            and benefits of the procedure and the sedation                            options and risks were discussed with the patient.                            All questions were answered, and informed consent                            was obtained. Prior Anticoagulants: The patient has                            taken no previous anticoagulant or antiplatelet                            agents. ASA Grade Assessment: II - A patient with                            mild systemic disease. After reviewing the risks                            and benefits, the patient was deemed in                            satisfactory condition to undergo the procedure.  After obtaining informed consent, the colonoscope                            was passed under direct vision. Throughout the                            procedure, the patient's blood pressure, pulse, and                            oxygen saturations were monitored continuously. The                            Colonoscope was introduced through the anus and                            advanced to the the cecum,  identified by                            appendiceal orifice and ileocecal valve. The                            colonoscopy was performed without difficulty. The                            patient tolerated the procedure well. The quality                            of the bowel preparation was good. The ileocecal                            valve, appendiceal orifice, and rectum were                            photographed. Scope In: 10:24:25 AM Scope Out: 10:43:28 AM Scope Withdrawal Time: 0 hours 12 minutes 9 seconds  Total Procedure Duration: 0 hours 19 minutes 3 seconds  Findings:                 The perianal exam findings include non-thrombosed                            external hemorrhoids and non-thrombosed internal                            hemorrhoids.                           Two sessile polyps were found in the ascending                            colon. The polyps were 4 to 5 mm in size. These                            polyps were removed with a cold snare. Resection  and retrieval were complete.                           Two sessile polyps were found in the hepatic                            flexure. The polyps were 3 to 5 mm in size. These                            polyps were removed with a cold snare. Resection                            and retrieval were complete.                           A 5 mm polyp was found in the transverse colon. The                            polyp was sessile. The polyp was removed with a                            cold snare. Resection and retrieval were complete.                           A 3 mm polyp was found in the recto-sigmoid colon.                            The polyp was sessile. The polyp was removed with a                            cold snare. Resection and retrieval were complete.                           A few small-mouthed diverticula were found in the                            sigmoid colon.                            External and internal hemorrhoids were found during                            retroflexion and during digital exam. The                            hemorrhoids were medium-sized. Complications:            No immediate complications. Estimated Blood Loss:     Estimated blood loss was minimal. Impression:               - Two 4 to 5 mm polyps in the ascending colon,                            removed with a cold  snare. Resected and retrieved.                           - Two 3 to 5 mm polyps at the hepatic flexure,                            removed with a cold snare. Resected and retrieved.                           - One 5 mm polyp in the transverse colon, removed                            with a cold snare. Resected and retrieved.                           - One 3 mm polyp at the recto-sigmoid colon,                            removed with a cold snare. Resected and retrieved.                           - Mild diverticulosis in the sigmoid colon.                           - External and internal hemorrhoids. Recommendation:           - Patient has a contact number available for                            emergencies. The signs and symptoms of potential                            delayed complications were discussed with the                            patient. Return to normal activities tomorrow.                            Written discharge instructions were provided to the                            patient.                           - Resume previous diet.                           - Continue present medications.                           - Await pathology results.                           - Repeat colonoscopy is recommended for  surveillance. The colonoscopy date will be                            determined after pathology results from today's                            exam become available for review. Jerene Bears, MD 10/13/2016  10:51:24 AM This report has been signed electronically.

## 2016-10-13 NOTE — Progress Notes (Signed)
Called to room to assist during endoscopic procedure.  Patient ID and intended procedure confirmed with present staff. Received instructions for my participation in the procedure from the performing physician.  

## 2016-10-13 NOTE — Patient Instructions (Signed)
YOU HAD AN ENDOSCOPIC PROCEDURE TODAY AT THE [facility]:   Refer to the procedure report that was given to you for any specific questions about what was found during the examination.  If the procedure report does not answer your questions, please call your gastroenterologist to clarify.  If you requested that your care partner not be given the details of your procedure findings, then the procedure report has been included in a sealed envelope for you to review at your convenience later.  YOU SHOULD EXPECT: Some feelings of bloating in the abdomen. Passage of more gas than usual.  Walking can help get rid of the air that was put into your GI tract during the procedure and reduce the bloating. If you had a lower endoscopy (such as a colonoscopy or flexible sigmoidoscopy) you may notice spotting of blood in your stool or on the toilet paper. If you underwent a bowel prep for your procedure, you may not have a normal bowel movement for a few days.  Please Note:  You might notice some irritation and congestion in your nose or some drainage.  This is from the oxygen used during your procedure.  There is no need for concern and it should clear up in a day or so.  SYMPTOMS TO REPORT IMMEDIATELY:   Following lower endoscopy (colonoscopy or flexible sigmoidoscopy):  Excessive amounts of blood in the stool  Significant tenderness or worsening of abdominal pains  Swelling of the abdomen that is new, acute  Fever of 100F or higher  For urgent or emergent issues, a gastroenterologist can be reached at any hour by calling [phone].   DIET:  We do recommend a small meal at first, but then you may proceed to your regular diet.  Drink plenty of fluids but you should avoid alcoholic beverages for 24 hours.  ACTIVITY:  You should plan to take it easy for the rest of today and you should NOT DRIVE or use heavy machinery until tomorrow (because of the sedation medicines used during the test).     FOLLOW UP: Our staff will call the number listed on your records the next business day following your procedure to check on you and address any questions or concerns that you may have regarding the information given to you following your procedure. If we do not reach you, we will leave a message.  However, if you are feeling well and you are not experiencing any problems, there is no need to return our call.  We will assume that you have returned to your regular daily activities without incident.  If any biopsies were taken you will be contacted by phone or by letter within the next 1-3 weeks.  Please call us at [phone] if you have not heard about the biopsies in 3 weeks.   Await for biopsy results to determine next repeat Colonoscopy screening Polyps (handout given) Diverticulosis (handout given) Hemorrhoids (handout given)   SIGNATURES/CONFIDENTIALITY: You and/or your care partner have signed paperwork which will be entered into your electronic medical record.  These signatures attest to the fact that that the information above on your After Visit Summary has been reviewed and is understood.  Full responsibility of the confidentiality of this discharge information lies with you and/or your care-partner. 

## 2016-10-14 ENCOUNTER — Telehealth: Payer: Self-pay

## 2016-10-14 NOTE — Telephone Encounter (Signed)
  Follow up Call-  Call back number 10/13/2016  Post procedure Call Back phone  # [phone] or wife [phone]  Permission to leave phone message No  Some recent data might be hidden     Patient questions:  Do you have a fever, pain , or abdominal swelling? No. Pain Score  0 *  Have you tolerated food without any problems? Yes.    Have you been able to return to your normal activities? Yes.    Do you have any questions about your discharge instructions: Diet   No. Medications  No. Follow up visit  No.  Do you have questions or concerns about your Care? No.  Actions: * If pain score is 4 or above: No action needed, pain <4.  No problems noted per pt's wife.  maw

## 2016-10-14 NOTE — Telephone Encounter (Signed)
Patient calling back states he is doing fine and does not need a call back from the nurse.

## 2016-10-15 ENCOUNTER — Encounter: Payer: Self-pay | Admitting: Internal Medicine

## 2016-10-29 ENCOUNTER — Other Ambulatory Visit: Payer: Self-pay

## 2016-10-29 MED ORDER — OXYCODONE-ACETAMINOPHEN 5-325 MG PO TABS: 1.0000 | ORAL_TABLET | Freq: Three times a day (TID) | ORAL | 0 refills | Status: DC | PRN

## 2016-10-29 NOTE — Telephone Encounter (Signed)
Pt left v/m requesting rx oxycodone apap. Call when ready for pick up. Last printed # 90 on 09/29/16. Pt last annual 08/11/16.

## 2016-10-29 NOTE — Telephone Encounter (Signed)
Spoke to pt. Advised him that rx was up front. Also told him to schedule an appt on or after 11-11-16 for pain med management

## 2016-10-29 NOTE — Telephone Encounter (Signed)
Will be due for 3 month appt soon then

## 2016-11-18 ENCOUNTER — Ambulatory Visit (INDEPENDENT_AMBULATORY_CARE_PROVIDER_SITE_OTHER): Payer: Medicare Other | Admitting: Internal Medicine

## 2016-11-18 ENCOUNTER — Encounter (INDEPENDENT_AMBULATORY_CARE_PROVIDER_SITE_OTHER): Payer: Self-pay

## 2016-11-18 ENCOUNTER — Encounter: Payer: Self-pay | Admitting: Internal Medicine

## 2016-11-18 VITALS — BP 118/74 | HR 51 | Temp 98.0°F | Ht 68.0 in | Wt 159.8 lb

## 2016-11-18 DIAGNOSIS — F1120 Opioid dependence, uncomplicated: Secondary | ICD-10-CM

## 2016-11-18 NOTE — Assessment & Plan Note (Signed)
Chronic back pain Using the oxycodone sparingly--uses the marijuana to stretch the narcotic and relieve some pain as well Not as edgy with this regimen Reviewed CSRS---no Rx other than here Will defer UDS this time

## 2016-11-18 NOTE — Progress Notes (Signed)
   Subjective:    Patient ID: Jeffrey Mcbride, male    DOB: July 06, 1955, 61 y.o.   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 acetaminophen (TYLENOL) 500 MG tablet Take 250 mg by mouth every 6 (six) hours as needed.    . gabapentin (NEURONTIN) 100 MG capsule Take 1 capsule (100 mg total) by mouth 3 (three) times daily. 90 capsule 11  . ibuprofen (ADVIL,MOTRIN) 200 MG tablet Take 200 mg by mouth 3 (three) times daily.    Marland Kitchen oxyCODONE-acetaminophen (ROXICET) 5-325 MG tablet Take 1 tablet by mouth every 8 (eight) hours as needed for severe pain. 90 tablet 0   No current facility-administered medications on file prior to visit.     Allergies  Allergen Reactions  . Motrin [Ibuprofen] Other (See Comments)    High doses gives pt gas  . Naprosyn [Naproxen] Other (See Comments)    Elevated blood pressure    Past Medical History:  Diagnosis Date  . Allergic rhinitis due to pollen   . Back pain   . Hypertension   . Insomnia   . Lumbar back pain with radiculopathy affecting left lower extremity     Past Surgical History:  Procedure Laterality Date  . BACK SURGERY    . [place] SURGERY  2004  . LUMBAR FUSION     Dr Ronnald Ramp  . VASECTOMY      Family History  Problem Relation Age of Onset  . Heart disease Mother   . Hyperlipidemia Mother   . Heart disease Father   . Hyperlipidemia Father   . Alcohol abuse Sister   . Esophageal cancer Neg Hx   . Colon cancer Neg Hx   . Rectal cancer Neg Hx   . Stomach cancer Neg Hx     Social History   Social History  .  Marital status: Married    Spouse name: N/A  . Number of children: 3  . Years of education: N/A   Occupational History  . HVAC--disabled    Social History Main Topics  . Smoking status: Never Smoker  . Smokeless tobacco: Never Used  . Alcohol use 0.0 oz/week     Comment: "very little"  . Drug use: No  . Sexual activity: Not on file   Other Topics Concern  . Not on file   Social History Narrative   3 daughters   Disabled due to back problems   Caregiver for wife-- and helps with multiple grandchildren      No living will   Would want wife to make decisions   Would accept resuscitation attempts   Probably wouldn't want tube feeds if cognitively unaware   Review of Systems Appetite is okay Weight stable    Objective:   Physical Exam  Constitutional: No distress.  Psychiatric: He has a normal mood and affect. His behavior is normal.          Assessment & Plan:

## 2016-12-01 ENCOUNTER — Other Ambulatory Visit: Payer: Self-pay

## 2016-12-01 MED ORDER — OXYCODONE-ACETAMINOPHEN 5-325 MG PO TABS: 1.0000 | ORAL_TABLET | Freq: Three times a day (TID) | ORAL | 0 refills | Status: DC | PRN

## 2016-12-01 NOTE — Telephone Encounter (Signed)
Pt left v/m requesting rx oxycodone apap. Call when ready for pick up. Last printed # 90 on 10/29/16; last seen 11/18/16.

## 2016-12-01 NOTE — Telephone Encounter (Signed)
Tried to call pt. VM not set up. Rx up front ready for pickup

## 2016-12-07 NOTE — Telephone Encounter (Signed)
Checked at the front desk and pt has picked up and signed for Rx

## 2016-12-30 ENCOUNTER — Other Ambulatory Visit: Payer: Self-pay

## 2016-12-30 MED ORDER — OXYCODONE-ACETAMINOPHEN 5-325 MG PO TABS: 1.0000 | ORAL_TABLET | Freq: Three times a day (TID) | ORAL | 0 refills | Status: DC | PRN

## 2016-12-30 NOTE — Telephone Encounter (Signed)
Spoke to pt. Rx up front ready for pickup 

## 2016-12-30 NOTE — Telephone Encounter (Signed)
Pt left v/m requesting rx oxycodone apap. Call when ready for pick up. Last printed # 90 on 12/01/16. Last seen 11/18/16.

## 2017-01-28 ENCOUNTER — Other Ambulatory Visit: Payer: Self-pay | Admitting: Internal Medicine

## 2017-01-28 MED ORDER — OXYCODONE-ACETAMINOPHEN 5-325 MG PO TABS: 1.0000 | ORAL_TABLET | Freq: Three times a day (TID) | ORAL | 0 refills | Status: DC | PRN

## 2017-01-28 NOTE — Telephone Encounter (Signed)
Please review for refill.  

## 2017-01-28 NOTE — Telephone Encounter (Signed)
Last filled 12-30-16 #90 Last OV 11-18-16 Next OV 02-11-17

## 2017-01-28 NOTE — Telephone Encounter (Signed)
Copied from [place] [id number]. Topic: Quick Communication - See Telephone Encounter >> Jan 28, 2017 10:38 AM Arletha Grippe wrote: Pt needs refill on   oxyCODONE-acetaminophen (ROXICET) 5-325 MG tablet [facility] Pt phone is [phone] (H

## 2017-01-28 NOTE — Telephone Encounter (Signed)
Tried to call pt. No VM set up. Rx is up front ready for pickup. Please advise pt if he calls. Thanks

## 2017-02-11 ENCOUNTER — Ambulatory Visit (INDEPENDENT_AMBULATORY_CARE_PROVIDER_SITE_OTHER): Payer: Medicare Other | Admitting: Internal Medicine

## 2017-02-11 ENCOUNTER — Encounter: Payer: Self-pay | Admitting: Internal Medicine

## 2017-02-11 VITALS — BP 128/82 | HR 52 | Temp 97.8°F | Wt 159.0 lb

## 2017-02-11 DIAGNOSIS — F1120 Opioid dependence, uncomplicated: Secondary | ICD-10-CM | POA: Diagnosis not present

## 2017-02-11 DIAGNOSIS — M5416 Radiculopathy, lumbar region: Secondary | ICD-10-CM

## 2017-02-11 NOTE — Assessment & Plan Note (Signed)
Reviewed CSRS Nothing of concern

## 2017-02-11 NOTE — Progress Notes (Signed)
Subjective:    Patient ID: Jeffrey Mcbride, male    DOB: [birth year], 61 y.o.   MRN: [id number]  HPI Here for chronic pain visit and narcotic review  Ongoing pain Still finds the marijuana helpful Oxycodone helps---though he tries to cut them to prevent sedation Still walking regularly Now deer hunting again Has trouble sitting still   Current Outpatient Medications on File Prior to Visit  Medication Sig Dispense Refill  . acetaminophen (TYLENOL) 500 MG tablet Take 250 mg by mouth every 6 (six) hours as needed.    . gabapentin (NEURONTIN) 100 MG capsule Take 1 capsule (100 mg total) by mouth 3 (three) times daily. 90 capsule 11  . ibuprofen (ADVIL,MOTRIN) 200 MG tablet Take 200 mg by mouth 3 (three) times daily.    Marland Kitchen oxyCODONE-acetaminophen (ROXICET) 5-325 MG tablet Take 1 tablet by mouth every 8 (eight) hours as needed for severe pain. 90 tablet 0   No current facility-administered medications on file prior to visit.     Allergies  Allergen Reactions  . Motrin [Ibuprofen] Other (See Comments)    High doses gives pt gas  . Naprosyn [Naproxen] Other (See Comments)    Elevated blood pressure    Past Medical History:  Diagnosis Date  . Allergic rhinitis due to pollen   . Back pain   . Hypertension   . Insomnia   . Lumbar back pain with radiculopathy affecting left lower extremity     Past Surgical History:  Procedure Laterality Date  . BACK SURGERY    . [place] SURGERY  2004  . LUMBAR FUSION     Dr Ronnald Ramp  . VASECTOMY      Family History  Problem Relation Age of Onset  . Heart disease Mother   . Hyperlipidemia Mother   . Heart disease Father   . Hyperlipidemia Father   . Alcohol abuse Sister   . Esophageal cancer Neg Hx   . Colon cancer Neg Hx   . Rectal cancer Neg Hx   . Stomach cancer Neg Hx     Social History   Socioeconomic History  . Marital status: Married    Spouse name: Not on file  . Number of children: 3  . Years of education: Not  on file  . Highest education level: Not on file  Social Needs  . Financial resource strain: Not on file  . Food insecurity - worry: Not on file  . Food insecurity - inability: Not on file  . Transportation needs - medical: Not on file  . Transportation needs - non-medical: Not on file  Occupational History  . Occupation: HVAC--disabled  Tobacco Use  . Smoking status: Never Smoker  . Smokeless tobacco: Never Used  Substance and Sexual Activity  . Alcohol use: Yes    Alcohol/week: 0.0 oz    Comment: "very little"  . Drug use: No  . Sexual activity: Not on file  Other Topics Concern  . Not on file  Social History Narrative   3 daughters   Disabled due to back problems   Caregiver for wife-- and helps with multiple grandchildren      No living will   Would want wife to make decisions   Would accept resuscitation attempts   Probably wouldn't want tube feeds if cognitively unaware   Review of Systems  Sleep is variable---often awakened or limited by pain Appetite is fine Weight stable     Objective:   Physical Exam  Constitutional: No distress.  Psychiatric: He has a normal mood and affect. His behavior is normal.          Assessment & Plan:

## 2017-02-11 NOTE — Assessment & Plan Note (Signed)
Ongoing chronic pain Tries to walk and maintain independence

## 2017-02-24 ENCOUNTER — Other Ambulatory Visit: Payer: Self-pay | Admitting: Internal Medicine

## 2017-02-24 MED ORDER — OXYCODONE-ACETAMINOPHEN 5-325 MG PO TABS: 1.0000 | ORAL_TABLET | Freq: Three times a day (TID) | ORAL | 0 refills | Status: DC | PRN

## 2017-02-24 NOTE — Telephone Encounter (Signed)
Copied from [place] [id number]. Topic: Quick Communication - See Telephone Encounter >> Feb 24, 2017 10:56 AM Bea Graff, NT wrote: CRM for notification. See Telephone encounter for: Patient needs a refill of percocet 5/325. He uses [facility] Staff.  02/24/17.

## 2017-02-24 NOTE — Telephone Encounter (Signed)
Spoke to pt. Rx up front ready for pickup 

## 2017-02-24 NOTE — Telephone Encounter (Signed)
Pt requesting rx oxycodone apap. Call when ready for pick up. Last printed #90 on 01/28/17; last seen 02/11/17; last UDS 07/07/16

## 2017-02-24 NOTE — Telephone Encounter (Signed)
Controlled substance 

## 2017-03-31 ENCOUNTER — Other Ambulatory Visit: Payer: Self-pay | Admitting: Internal Medicine

## 2017-03-31 MED ORDER — OXYCODONE-ACETAMINOPHEN 5-325 MG PO TABS: 1.0000 | ORAL_TABLET | Freq: Three times a day (TID) | ORAL | 0 refills | Status: DC | PRN

## 2017-03-31 NOTE — Telephone Encounter (Signed)
Requesting Oxycodone apap rx. Call when ready for pick up. Last printed # 90 on 02/24/17; last seen 02/11/17 and last UDS 07/07/16. Dr Silvio Pate out of office.Please advise.

## 2017-03-31 NOTE — Telephone Encounter (Signed)
Chart reviewed. It appears that the patient is in compliance with 3 month visits, and UDS, database checks.  My partner is out of town and unavailable to prescribe.

## 2017-03-31 NOTE — Telephone Encounter (Signed)
Phone number [phone] not in service.  Tried calling patient's cell phone and voicemail is not set up.  Prescription is ready to be picked up at the front desk if patient or wife calls back.

## 2017-03-31 NOTE — Telephone Encounter (Signed)
Copied from [place] [id number]. Topic: Quick Communication - See Telephone Encounter >> Mar 31, 2017  9:37 AM Percell Belt A wrote: CRM for notification. See Telephone encounter for: pt called in and needs refill on his  oxyCODONE-acetaminophen (ROXICET) 5-325 MG tablet [place]  03/31/17.

## 2017-03-31 NOTE — Telephone Encounter (Signed)
Oxycodone refill. Last refill on 02/24/17. Last OV 02/11/17.

## 2017-04-29 ENCOUNTER — Other Ambulatory Visit: Payer: Self-pay | Admitting: Internal Medicine

## 2017-04-29 MED ORDER — OXYCODONE-ACETAMINOPHEN 5-325 MG PO TABS: 1.0000 | ORAL_TABLET | Freq: Three times a day (TID) | ORAL | 0 refills | Status: DC | PRN

## 2017-04-29 NOTE — Telephone Encounter (Signed)
Requesting refill oxycodone apap; last refilled #90 on 03/31/17; last seen 02/11/17 and UDS done 07/07/16. I spoke with pt and he has chosen Advertising account executive.

## 2017-04-29 NOTE — Telephone Encounter (Signed)
.  medf

## 2017-04-29 NOTE — Telephone Encounter (Signed)
Copied from [place] [phone]. Topic: Quick Communication - Rx Refill/Question >> Apr 29, 2017 10:27 AM Percell Belt A wrote: Medication: oxyCODONE-acetaminophen (ROXICET) 5-325 MG tablet [[id number]]    Has the patient contacted their pharmacy? no   (Agent: If no, request that the patient contact the pharmacy for the refill.)   Preferred Pharmacy (with phone number or street name): pt needs refill sent to [facility]: Please be advised that RX refills may take up to 3 business days. We ask that you follow-up with your pharmacy.

## 2017-04-29 NOTE — Telephone Encounter (Signed)
Med refill for  Oxycodone-acetaminophen.

## 2017-05-19 ENCOUNTER — Encounter: Payer: Self-pay | Admitting: Radiology

## 2017-05-19 ENCOUNTER — Encounter: Payer: Self-pay | Admitting: Internal Medicine

## 2017-05-19 ENCOUNTER — Ambulatory Visit (INDEPENDENT_AMBULATORY_CARE_PROVIDER_SITE_OTHER): Payer: Medicare Other | Admitting: Internal Medicine

## 2017-05-19 VITALS — BP 140/76 | HR 60 | Temp 97.7°F | Wt 155.0 lb

## 2017-05-19 DIAGNOSIS — J301 Allergic rhinitis due to pollen: Secondary | ICD-10-CM

## 2017-05-19 DIAGNOSIS — M5416 Radiculopathy, lumbar region: Secondary | ICD-10-CM | POA: Diagnosis not present

## 2017-05-19 DIAGNOSIS — F1120 Opioid dependence, uncomplicated: Secondary | ICD-10-CM

## 2017-05-19 NOTE — Progress Notes (Signed)
Subjective:    Patient ID: Jeffrey Mcbride, male    DOB: [birth year], 62 y.o.   MRN: [id number]  HPI Here for review of chronic back pain and narcotic dependence  Ongoing back pain Wonders if the oxycodone is different since changing pharmacies Uses ibuprofen occasionally---will get some tinnitus at times  Having trouble with his left foot again Notices after running tiller in garden Hurts on both sides No problem with weight bearing--unless it is twisted somewhat  Having some sinus symptoms Ear pain or pressure Some green drainage and cough Seems to be seasonal--?allergies Uses OTC antihistamine--- helps him sleep also Home is dry---electric heat. Discussed humidifier  Current Outpatient Medications on File Prior to Visit  Medication Sig Dispense Refill  . acetaminophen (TYLENOL) 500 MG tablet Take 250 mg by mouth every 6 (six) hours as needed.    . gabapentin (NEURONTIN) 100 MG capsule Take 1 capsule (100 mg total) by mouth 3 (three) times daily. 90 capsule 11  . ibuprofen (ADVIL,MOTRIN) 200 MG tablet Take 200 mg by mouth 3 (three) times daily.    Marland Kitchen oxyCODONE-acetaminophen (ROXICET) 5-325 MG tablet Take 1 tablet by mouth every 8 (eight) hours as needed for severe pain. 90 tablet 0   No current facility-administered medications on file prior to visit.     Allergies  Allergen Reactions  . Motrin [Ibuprofen] Other (See Comments)    High doses gives pt gas  . Naprosyn [Naproxen] Other (See Comments)    Elevated blood pressure    Past Medical History:  Diagnosis Date  . Allergic rhinitis due to pollen   . Back pain   . Hypertension   . Insomnia   . Lumbar back pain with radiculopathy affecting left lower extremity     Past Surgical History:  Procedure Laterality Date  . BACK SURGERY    . [place] SURGERY  2004  . LUMBAR FUSION     Dr Ronnald Ramp  . VASECTOMY      Family History  Problem Relation Age of Onset  . Heart disease Mother   . Hyperlipidemia  Mother   . Heart disease Father   . Hyperlipidemia Father   . Alcohol abuse Sister   . Esophageal cancer Neg Hx   . Colon cancer Neg Hx   . Rectal cancer Neg Hx   . Stomach cancer Neg Hx     Social History   Socioeconomic History  . Marital status: Married    Spouse name: Not on file  . Number of children: 3  . Years of education: Not on file  . Highest education level: Not on file  Social Needs  . Financial resource strain: Not on file  . Food insecurity - worry: Not on file  . Food insecurity - inability: Not on file  . Transportation needs - medical: Not on file  . Transportation needs - non-medical: Not on file  Occupational History  . Occupation: HVAC--disabled  Tobacco Use  . Smoking status: Never Smoker  . Smokeless tobacco: Never Used  Substance and Sexual Activity  . Alcohol use: Yes    Alcohol/week: 0.0 oz    Comment: "very little"  . Drug use: No  . Sexual activity: Not on file  Other Topics Concern  . Not on file  Social History Narrative   3 daughters   Disabled due to back problems   Caregiver for wife-- and helps with multiple grandchildren      No living will   Would want wife  to make decisions   Would accept resuscitation attempts   Probably wouldn't want tube feeds if cognitively unaware   Review of Systems Uses the gabapentin at night --but not all the time with the antihistamine Appetite is okay    Objective:   Physical Exam  Constitutional: He appears well-developed. No distress.  HENT:  Mouth/Throat: Oropharynx is clear and moist. No oropharyngeal exudate.  No sinus tenderness  Pulmonary/Chest: Effort normal and breath sounds normal. No respiratory distress. He has no wheezes. He has no rales.  Musculoskeletal:  No bony tenderness of left ankle Normal ROM          Assessment & Plan:

## 2017-05-19 NOTE — Assessment & Plan Note (Signed)
Chronic pain Tries to stay active

## 2017-05-19 NOTE — Assessment & Plan Note (Signed)
Continues on the oxycodone CSRS reviewed--no concern

## 2017-05-19 NOTE — Assessment & Plan Note (Signed)
Congestion now may be related to dry heat as well Discussed humidifier Can continue the antihistamine

## 2017-05-22 LAB — PAIN MGMT, PROFILE 8 W/CONF, U
6 Acetylmorphine: NEGATIVE ng/mL (ref <10)
AMPHETAMINES: NEGATIVE ng/mL (ref <500)
Alcohol Metabolites: NEGATIVE ng/mL (ref <500)
BUPRENORPHINE, URINE: NEGATIVE ng/mL (ref <5)
Benzodiazepines: NEGATIVE ng/mL (ref <100)
CODEINE: NEGATIVE ng/mL (ref <50)
Cocaine Metabolite: NEGATIVE ng/mL (ref <150)
Creatinine: 233.6 mg/dL
HYDROCODONE: NEGATIVE ng/mL (ref <50)
Hydromorphone: NEGATIVE ng/mL (ref <50)
MDMA: NEGATIVE ng/mL (ref <500)
Marijuana Metabolite: 1699 ng/mL — ABNORMAL HIGH (ref <5)
Marijuana Metabolite: POSITIVE ng/mL — AB (ref <20)
Morphine: NEGATIVE ng/mL (ref <50)
NORHYDROCODONE: NEGATIVE ng/mL (ref <50)
NOROXYCODONE: 4509 ng/mL — AB (ref <50)
OPIATES: NEGATIVE ng/mL (ref <100)
OXYCODONE: POSITIVE ng/mL — AB (ref <100)
Oxidant: NEGATIVE ug/mL (ref <200)
Oxycodone: 1514 ng/mL — ABNORMAL HIGH (ref <50)
Oxymorphone: 3244 ng/mL — ABNORMAL HIGH (ref <50)
pH: 5.53 (ref 4.5–9.0)

## 2017-06-02 ENCOUNTER — Other Ambulatory Visit: Payer: Self-pay | Admitting: Internal Medicine

## 2017-06-02 NOTE — Telephone Encounter (Signed)
Copied from [place] [id number] [id number]. Topic: Quick Communication - Rx Refill/Question >> Jun 02, 2017  1:31 PM Corie Chiquito, [place] wrote: Medication: Oxycodone 5-325-mg   Has the patient contacted their pharmacy? No  Patient calling because he needs a refill on his above medication. He hasn't been in contact with the pharmacy but I did advise him of contacting the pharmacy as well about his refill   Preferred Pharmacy (with phone number or street name): [facility] [place] [facility] [phone]   Agent: Please be advised that RX refills may take up to 3 business days. We ask that you follow-up with your pharmacy.

## 2017-06-03 MED ORDER — OXYCODONE-ACETAMINOPHEN 5-325 MG PO TABS: 1.0000 | ORAL_TABLET | Freq: Three times a day (TID) | ORAL | 0 refills | Status: DC | PRN

## 2017-06-03 NOTE — Telephone Encounter (Signed)
Roxicet refill Request - controlled substance  LOV 05/19/17  Dr. Silvio Pate  [facility] [address], [place]

## 2017-06-03 NOTE — Telephone Encounter (Signed)
Request for oxycodone apap 5-325 ; Last filled #90 on 04/29/17 Last seen 05/19/17 UDS; 07/07/[facility] [address] Jeffrey Mcbride is out of office until 06/07/17.Please advise.

## 2017-06-03 NOTE — Telephone Encounter (Signed)
Eprescribed.

## 2017-07-05 ENCOUNTER — Other Ambulatory Visit: Payer: Self-pay | Admitting: Internal Medicine

## 2017-07-05 NOTE — Telephone Encounter (Signed)
Rx refill request: oxycodone- acetaminophen 5-325 mg  LOV: 05/19/17  PCP: [place]: verified

## 2017-07-05 NOTE — Telephone Encounter (Signed)
Forward to Dr Darnell Level in Dr Alla German absence  Last UDS/CSA 05-19-17

## 2017-07-05 NOTE — Telephone Encounter (Signed)
Copied from [place] [phone]. Topic: General - Other >> Jul 05, 2017 10:51 AM Darl Householder, RMA wrote: Reason for CRM: Medication refill request for oxyCODONE-acetaminophen (ROXICET) 5-325 MG tablet to be sent to [facility]

## 2017-07-07 MED ORDER — OXYCODONE-ACETAMINOPHEN 5-325 MG PO TABS: 1.0000 | ORAL_TABLET | Freq: Three times a day (TID) | ORAL | 0 refills | Status: DC | PRN

## 2017-07-07 NOTE — Telephone Encounter (Signed)
Eprescribed.

## 2017-08-04 ENCOUNTER — Other Ambulatory Visit: Payer: Self-pay | Admitting: Internal Medicine

## 2017-08-04 NOTE — Telephone Encounter (Signed)
Copied from [place] [phone]. Topic: Quick Communication - Rx Refill/Question >> Aug 04, 2017  9:35 AM Neva Seat wrote: oxyCODONE-acetaminophen (ROXICET) 5-325 MG tablet  Needing refill  [id number] [place] [facility] [address] [place] [address] Phone: [phone] Fax: [phone]

## 2017-08-04 NOTE — Telephone Encounter (Signed)
Refill request oxycodone  Last filled 07/07/2017 90 tabs  1 q 8 hrs PRN  Last OV  05/19/2017  Dr Silvio Pate

## 2017-08-05 MED ORDER — OXYCODONE-ACETAMINOPHEN 5-325 MG PO TABS: 1.0000 | ORAL_TABLET | Freq: Three times a day (TID) | ORAL | 0 refills | Status: DC | PRN

## 2017-08-05 NOTE — Telephone Encounter (Signed)
UDS:05/19/17 and verified LOV and last refilled.

## 2017-08-20 ENCOUNTER — Ambulatory Visit (INDEPENDENT_AMBULATORY_CARE_PROVIDER_SITE_OTHER): Payer: Medicare Other | Admitting: Internal Medicine

## 2017-08-20 ENCOUNTER — Encounter: Payer: Self-pay | Admitting: Internal Medicine

## 2017-08-20 VITALS — BP 120/70 | HR 45 | Temp 98.0°F | Ht 68.25 in | Wt 150.0 lb

## 2017-08-20 DIAGNOSIS — F1120 Opioid dependence, uncomplicated: Secondary | ICD-10-CM

## 2017-08-20 DIAGNOSIS — Z0000 Encounter for general adult medical examination without abnormal findings: Secondary | ICD-10-CM

## 2017-08-20 DIAGNOSIS — J301 Allergic rhinitis due to pollen: Secondary | ICD-10-CM

## 2017-08-20 DIAGNOSIS — I10 Essential (primary) hypertension: Secondary | ICD-10-CM

## 2017-08-20 DIAGNOSIS — M5416 Radiculopathy, lumbar region: Secondary | ICD-10-CM

## 2017-08-20 DIAGNOSIS — Z7189 Other specified counseling: Secondary | ICD-10-CM

## 2017-08-20 LAB — COMPREHENSIVE METABOLIC PANEL
ALBUMIN: 4.5 g/dL (ref 3.5–5.2)
ALT: 19 U/L (ref 0–53)
AST: 21 U/L (ref 0–37)
Alkaline Phosphatase: 74 U/L (ref 39–117)
BILIRUBIN TOTAL: 0.6 mg/dL (ref 0.2–1.2)
BUN: 13 mg/dL (ref 6–23)
CO2: 28 mEq/L (ref 19–32)
Calcium: 9.5 mg/dL (ref 8.4–10.5)
Chloride: 103 mEq/L (ref 96–112)
Creatinine, Ser: 1.17 mg/dL (ref 0.40–1.50)
GFR: 66.77 mL/min (ref >60.00)
GLUCOSE: 98 mg/dL (ref 70–99)
Potassium: 4.1 mEq/L (ref 3.5–5.1)
SODIUM: 139 meq/L (ref 135–145)
TOTAL PROTEIN: 7.3 g/dL (ref 6.0–8.3)

## 2017-08-20 LAB — CBC
HCT: 40.9 % (ref 39.0–52.0)
HEMOGLOBIN: 13.9 g/dL (ref 13.0–17.0)
MCHC: 34.1 g/dL (ref 30.0–36.0)
MCV: 90.6 fl (ref 78.0–100.0)
PLATELETS: 270 10*3/uL (ref 150.0–400.0)
RBC: 4.51 Mil/uL (ref 4.22–5.81)
RDW: 14.1 % (ref 11.5–15.5)
WBC: 7.6 10*3/uL (ref 4.0–10.5)

## 2017-08-20 LAB — T4, FREE: Free T4: 0.96 ng/dL (ref 0.60–1.60)

## 2017-08-20 NOTE — Assessment & Plan Note (Signed)
I have personally reviewed the Medicare Annual Wellness questionnaire and have noted 1. The patient's medical and social history 2. Their use of alcohol, tobacco or illicit drugs 3. Their current medications and supplements 4. The patient's functional ability including ADL's, fall risks, home safety risks and hearing or visual             impairment. 5. Diet and physical activities 6. Evidence for depression or mood disorders  The patients weight, height, BMI and visual acuity have been recorded in the chart I have made referrals, counseling and provided education to the patient based review of the above and I have provided the pt with a written personalized care plan for preventive services.  I have provided you with a copy of your personalized plan for preventive services. Please take the time to review along with your updated medication list.  Discussed PSA--- he wishes to hold off again Repeat colon due 2023--had polyps Will accept flu vaccine in fall

## 2017-08-20 NOTE — Assessment & Plan Note (Signed)
Reviewed CSRS--no concerns 

## 2017-08-20 NOTE — Progress Notes (Signed)
Subjective:    Patient ID: Jeffrey Mcbride, male    DOB: Nov 21, 1955, 62 y.o.   MRN: [id number]  HPI Here for Medicare wellness visit and follow up of chronic medical conditions Reviewed form and advanced directives Reviewed other doctors No alcohol or tobacco Walks every day Vision is okay Constant tinnitus---some hearing loss from [employer] and machine noise in past No falls No depression or anhedonia Independent with instrumental ADLs No sig memory problems  Stays active with large garden Careful with his back Ongoing pain issues Wears high boots for ankle support Continues on the oxycodone as usual  No chest pain or SOB No dizziness or syncope No headaches Some left ankle pain still---no sig edema though Occasional palpitations---feels a flutter if he overexerts (has to drink a lot to prevent dehydration)  Current Outpatient Medications on File Prior to Visit  Medication Sig Dispense Refill  . acetaminophen (TYLENOL) 500 MG tablet Take 250 mg by mouth every 6 (six) hours as needed.    . gabapentin (NEURONTIN) 100 MG capsule Take 1 capsule (100 mg total) by mouth 3 (three) times daily. 90 capsule 11  . ibuprofen (ADVIL,MOTRIN) 200 MG tablet Take 200 mg by mouth 3 (three) times daily.    Marland Kitchen oxyCODONE-acetaminophen (ROXICET) 5-325 MG tablet Take 1 tablet by mouth every 8 (eight) hours as needed for severe pain. 90 tablet 0   No current facility-administered medications on file prior to visit.     Allergies  Allergen Reactions  . Motrin [Ibuprofen] Other (See Comments)    High doses gives pt gas  . Naprosyn [Naproxen] Other (See Comments)    Elevated blood pressure    Past Medical History:  Diagnosis Date  . Allergic rhinitis due to pollen   . Back pain   . Hypertension   . Insomnia   . Lumbar back pain with radiculopathy affecting left lower extremity     Past Surgical History:  Procedure Laterality Date  . BACK SURGERY    . [place] SURGERY  2004    . LUMBAR FUSION     Dr Ronnald Ramp  . VASECTOMY      Family History  Problem Relation Age of Onset  . Heart disease Mother   . Hyperlipidemia Mother   . Heart disease Father   . Hyperlipidemia Father   . Alcohol abuse Sister   . Esophageal cancer Neg Hx   . Colon cancer Neg Hx   . Rectal cancer Neg Hx   . Stomach cancer Neg Hx     Social History   Socioeconomic History  . Marital status: Married    Spouse name: Not on file  . Number of children: 3  . Years of education: Not on file  . Highest education level: Not on file  Occupational History  . Occupation: HVAC--disabled  Social Needs  . Financial resource strain: Not on file  . Food insecurity:    Worry: Not on file    Inability: Not on file  . Transportation needs:    Medical: Not on file    Non-medical: Not on file  Tobacco Use  . Smoking status: Never Smoker  . Smokeless tobacco: Never Used  Substance and Sexual Activity  . Alcohol use: Yes    Alcohol/week: 0.0 oz    Comment: "very little"  . Drug use: No  . Sexual activity: Not on file  Lifestyle  . Physical activity:    Days per week: Not on file    Minutes per  session: Not on file  . Stress: Not on file  Relationships  . Social connections:    Talks on phone: Not on file    Gets together: Not on file    Attends religious service: Not on file    Active member of club or organization: Not on file    Attends meetings of clubs or organizations: Not on file    Relationship status: Not on file  . Intimate partner violence:    Fear of current or ex partner: Not on file    Emotionally abused: Not on file    Physically abused: Not on file    Forced sexual activity: Not on file  Other Topics Concern  . Not on file  Social History Narrative   3 daughters   Disabled due to back problems   Caregiver for wife-- and helps with multiple grandchildren      No living will   Would want wife to make decisions---alternate daughter Rise Paganini   Would accept  resuscitation attempts   Probably wouldn't want tube feeds if cognitively unaware   Review of Systems Still with some seasonal allergy issues--- uses OTC antihistamine (allegra?) Sleeps fairly well--but wife snores Appetite is good Weight stable Teeth are okay except for broken teeth---hasn't been going to dentist Bowels okay---- some trouble with hemorrhoids (bleeding/itching) Voids okay. Nocturia x 2-3 at most. Wears seat belt Scattered injuries on skin. Some itching---but out in garden a lot. No dermatologist No heartburn or dysphagia    Objective:   Physical Exam  Constitutional: He is oriented to person, place, and time. He appears well-developed. No distress.  HENT:  Mouth/Throat: Oropharynx is clear and moist. No oropharyngeal exudate.  Neck: No thyromegaly present.  Cardiovascular: Normal rate, regular rhythm, normal heart sounds and intact distal pulses. Exam reveals no gallop.  No murmur heard. Respiratory: Effort normal and breath sounds normal. No respiratory distress. He has no wheezes. He has no rales.  GI: Soft. There is no tenderness.  Musculoskeletal: He exhibits no edema or tenderness.  Lymphadenopathy:    He has no cervical adenopathy.  Neurological: He is alert and oriented to person, place, and time.  President---"Donald Trump, Barack Obama, Clinton---Bush" [id number] D-l-[id number]-w Recall 2/3  Skin: No rash noted. No erythema.  Psychiatric: He has a normal mood and affect. His behavior is normal.           Assessment & Plan:

## 2017-08-20 NOTE — Assessment & Plan Note (Signed)
See social history 

## 2017-08-20 NOTE — Progress Notes (Signed)
Hearing Screening   Method: Audiometry   125Hz  250Hz  500Hz  1000Hz  2000Hz  3000Hz  4000Hz  6000Hz  8000Hz   Right ear:   20 20 25   0    Left ear:   20 25 20   0      Visual Acuity Screening   Right eye Left eye Both eyes  Without correction:     With correction: 20/20 20/20 20/15

## 2017-08-20 NOTE — Assessment & Plan Note (Signed)
Does okay with the OTC meds

## 2017-08-20 NOTE — Assessment & Plan Note (Signed)
Chronic pain issues Tries to stay active carefully Ongoing with narcotic

## 2017-08-20 NOTE — Assessment & Plan Note (Signed)
BP Readings from Last 3 Encounters:  08/20/17 120/70  05/19/17 140/76  02/11/17 128/82   Good control with lifestyle --no meds

## 2017-09-03 ENCOUNTER — Other Ambulatory Visit: Payer: Self-pay | Admitting: Internal Medicine

## 2017-09-03 NOTE — Telephone Encounter (Signed)
Copied from [place] [id number]. Topic: Quick Communication - Rx Refill/Question >> Sep 03, 2017  2:06 PM Selinda Flavin B, NT wrote: Medication: oxyCODONE-acetaminophen (ROXICET) 5-325 MG tablet  Has the patient contacted their pharmacy? Yes.   (Agent: If no, request that the patient contact the pharmacy for the refill.) (Agent: If yes, when and what did the pharmacy advise?)  Preferred Pharmacy (with phone number or street name): [id number] [place], [place]: Please be advised that RX refills may take up to 3 business days. We ask that you follow-up with your pharmacy.

## 2017-09-03 NOTE — Telephone Encounter (Signed)
I spoke with pt and pt does not need oxycodone apap until 09/06/17.  Pt last refill: # 90 on 08/05/17 Pt last seen 08/20/17 Pt has 3 mth f/u appt on 12/01/17 UDS: 05/19/17 Contract done 05/19/17. [facility]

## 2017-09-03 NOTE — Telephone Encounter (Signed)
Refill request oxycodone / acetaminophen  Lov 08/20/2017  Dr Silvio Pate   Last filled 08/05/2017  Dr Silvio Pate 90 tabs 1 q 8 hrs   Pharmacy on file

## 2017-09-06 MED ORDER — OXYCODONE-ACETAMINOPHEN 5-325 MG PO TABS: 1.0000 | ORAL_TABLET | Freq: Three times a day (TID) | ORAL | 0 refills | Status: DC | PRN

## 2017-10-04 ENCOUNTER — Other Ambulatory Visit: Payer: Self-pay | Admitting: Internal Medicine

## 2017-10-04 MED ORDER — OXYCODONE-ACETAMINOPHEN 5-325 MG PO TABS: 1.0000 | ORAL_TABLET | Freq: Three times a day (TID) | ORAL | 0 refills | Status: DC | PRN

## 2017-10-04 NOTE — Telephone Encounter (Signed)
Name of Medication: Oxycodone Name of Pharmacy: [facility] or Written Date and Quantity: 09-06-17 #90  Last Office Visit and Type: 08-20-17 [facility] Next Office Visit and Type: 12-01-17 3 Month F/U Last Controlled Substance Agreement Date: 05-19-17 Last UDS: 05-19-17

## 2017-10-04 NOTE — Telephone Encounter (Signed)
Copied from [place] [id number]. Topic: Quick Communication - Rx Refill/Question >> Oct 04, 2017 10:09 AM Scherrie Gerlach wrote: Medication: oxyCODONE-acetaminophen (ROXICET) 5-325 MG tablet  [id number] [place], [place] [phone] (Phone) [phone] (Fax)

## 2017-11-05 ENCOUNTER — Other Ambulatory Visit: Payer: Self-pay | Admitting: Internal Medicine

## 2017-11-05 MED ORDER — OXYCODONE-ACETAMINOPHEN 5-325 MG PO TABS: 1.0000 | ORAL_TABLET | Freq: Three times a day (TID) | ORAL | 0 refills | Status: DC | PRN

## 2017-11-05 NOTE — Telephone Encounter (Signed)
Name of Medication: Oxycodone Name of Pharmacy:  Last Fill or Written Date and Quantity: 10-04-17 #90 Last Office Visit and Type: 08-20-17 Next Office Visit and Type: 12-01-17 Last Controlled Substance Agreement Date: 05-19-17 Last UDS: 05-19-17

## 2017-11-05 NOTE — Telephone Encounter (Signed)
Copied from [place] [id number]. Topic: Quick Communication - Rx Refill/Question >> Nov 05, 2017 12:43 PM Bea Graff, NT wrote: Medication: oxyCODONE-acetaminophen (ROXICET) 5-325 MG tablet  Has the patient contacted their pharmacy? Yes.   (Agent: If no, request that the patient contact the pharmacy for the refill.) (Agent: If yes, when and what did the pharmacy advise?)  Preferred Pharmacy (with phone number or street name):  [id number] [place], [place] [phone] (Phone) [phone] (Fax)   Agent: Please be advised that RX refills may take up to 3 business days. We ask that you follow-up with your pharmacy.

## 2017-12-01 ENCOUNTER — Encounter: Payer: Self-pay | Admitting: Internal Medicine

## 2017-12-01 ENCOUNTER — Ambulatory Visit (INDEPENDENT_AMBULATORY_CARE_PROVIDER_SITE_OTHER): Payer: Medicare Other | Admitting: Internal Medicine

## 2017-12-01 VITALS — BP 136/80 | HR 52 | Temp 97.9°F | Ht 68.0 in | Wt 153.0 lb

## 2017-12-01 DIAGNOSIS — F1120 Opioid dependence, uncomplicated: Secondary | ICD-10-CM

## 2017-12-01 DIAGNOSIS — M5416 Radiculopathy, lumbar region: Secondary | ICD-10-CM

## 2017-12-01 MED ORDER — OXYCODONE-ACETAMINOPHEN 5-325 MG PO TABS: 1.0000 | ORAL_TABLET | Freq: Three times a day (TID) | ORAL | 0 refills | Status: DC | PRN

## 2017-12-01 NOTE — Assessment & Plan Note (Signed)
Stable Maintains function with the narcotic

## 2017-12-01 NOTE — Assessment & Plan Note (Signed)
Reviewed CSRS--no concerns Will give early refill today---leaving town tomorrow

## 2017-12-01 NOTE — Progress Notes (Signed)
Subjective:    Patient ID: Jeffrey Mcbride, male    DOB: 05-01-1955, 61 y.o.   MRN: [id number]  HPI Here for follow up of chronic pain and narcotic dependence  Reviewed his safety measures Keeps his medication locked up  Chronic back pain is about the same  Ongoing narcotic use---no change He tries to hold off--but will have bad lumbar pain  Current Outpatient Medications on File Prior to Visit  Medication Sig Dispense Refill  . acetaminophen (TYLENOL) 500 MG tablet Take 250 mg by mouth every 6 (six) hours as needed.    . gabapentin (NEURONTIN) 100 MG capsule Take 1 capsule (100 mg total) by mouth 3 (three) times daily. 90 capsule 11  . ibuprofen (ADVIL,MOTRIN) 200 MG tablet Take 200 mg by mouth 3 (three) times daily.    Marland Kitchen oxyCODONE-acetaminophen (ROXICET) 5-325 MG tablet Take 1 tablet by mouth every 8 (eight) hours as needed for severe pain. 90 tablet 0   No current facility-administered medications on file prior to visit.     Allergies  Allergen Reactions  . Motrin [Ibuprofen] Other (See Comments)    High doses gives pt gas  . Naprosyn [Naproxen] Other (See Comments)    Elevated blood pressure    Past Medical History:  Diagnosis Date  . Allergic rhinitis due to pollen   . Back pain   . Hypertension   . Insomnia   . Lumbar back pain with radiculopathy affecting left lower extremity     Past Surgical History:  Procedure Laterality Date  . BACK SURGERY    . [place] SURGERY  2004  . LUMBAR FUSION     Dr Ronnald Ramp  . VASECTOMY      Family History  Problem Relation Age of Onset  . Heart disease Mother   . Hyperlipidemia Mother   . Heart disease Father   . Hyperlipidemia Father   . Alcohol abuse Sister   . Esophageal cancer Neg Hx   . Colon cancer Neg Hx   . Rectal cancer Neg Hx   . Stomach cancer Neg Hx     Social History   Socioeconomic History  . Marital status: Married    Spouse name: Not on file  . Number of children: 3  . Years of  education: Not on file  . Highest education level: Not on file  Occupational History  . Occupation: HVAC--disabled  Social Needs  . Financial resource strain: Not on file  . Food insecurity:    Worry: Not on file    Inability: Not on file  . Transportation needs:    Medical: Not on file    Non-medical: Not on file  Tobacco Use  . Smoking status: Never Smoker  . Smokeless tobacco: Never Used  Substance and Sexual Activity  . Alcohol use: Yes    Alcohol/week: 0.0 standard drinks    Comment: "very little"  . Drug use: No  . Sexual activity: Not on file  Lifestyle  . Physical activity:    Days per week: Not on file    Minutes per session: Not on file  . Stress: Not on file  Relationships  . Social connections:    Talks on phone: Not on file    Gets together: Not on file    Attends religious service: Not on file    Active member of club or organization: Not on file    Attends meetings of clubs or organizations: Not on file    Relationship status: Not  on file  . Intimate partner violence:    Fear of current or ex partner: Not on file    Emotionally abused: Not on file    Physically abused: Not on file    Forced sexual activity: Not on file  Other Topics Concern  . 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 No distress.  Psychiatric: He has a normal mood and affect. His behavior is normal.            Assessment & Plan:

## 2018-01-03 ENCOUNTER — Other Ambulatory Visit: Payer: Self-pay | Admitting: Internal Medicine

## 2018-01-03 NOTE — Telephone Encounter (Signed)
Copied from [place] [id number]. Topic: Quick Communication - Rx Refill/Question >> Jan 03, 2018  4:28 PM Cecelia Byars, NT wrote: Medication: oxyCODONE-acetaminophen (ROXICET) 5-325 MG tablet  Has the patient contacted their pharmacy? no (Agent: If no, request that the patient contact the pharmacy for the refill (Agent: If yes, when and what did the pharmacy advise?  Preferred Pharmacy (with phone number or street name  [id number] [place], [place] [phone] (Phone) [phone] (Fax)    Agent: Please be advised that RX refills may take up to 3 business days. We ask that you follow-up with your pharmacy.

## 2018-01-04 MED ORDER — OXYCODONE-ACETAMINOPHEN 5-325 MG PO TABS: 1.0000 | ORAL_TABLET | Freq: Three times a day (TID) | ORAL | 0 refills | Status: DC | PRN

## 2018-01-04 NOTE — Telephone Encounter (Signed)
Name of Medication: oxycodone apap 5-325 mg Name of Pharmacy: [facility] [place] or Written Date and Quantity: #90 on 12/01/17 Last Office Visit and Type: 12/01/17 3 mth narcotic FU Next Office Visit and Type: 03/02/18 3 mth FU Last Controlled Substance Agreement Date: 05/19/17 Last UDS:05/19/17

## 2018-02-03 ENCOUNTER — Telehealth: Payer: Self-pay | Admitting: Internal Medicine

## 2018-02-03 MED ORDER — OXYCODONE-ACETAMINOPHEN 5-325 MG PO TABS: 1.0000 | ORAL_TABLET | Freq: Three times a day (TID) | ORAL | 0 refills | Status: DC | PRN

## 2018-02-03 NOTE — Telephone Encounter (Signed)
Pt called in to get a refill on Oxycodone 5-325 mg. [facility] [place].

## 2018-03-02 ENCOUNTER — Ambulatory Visit (INDEPENDENT_AMBULATORY_CARE_PROVIDER_SITE_OTHER): Payer: Medicare Other | Admitting: Internal Medicine

## 2018-03-02 ENCOUNTER — Encounter: Payer: Self-pay | Admitting: Internal Medicine

## 2018-03-02 VITALS — BP 138/76 | HR 51 | Temp 97.4°F | Ht 68.0 in | Wt 158.0 lb

## 2018-03-02 DIAGNOSIS — F1120 Opioid dependence, uncomplicated: Secondary | ICD-10-CM

## 2018-03-02 DIAGNOSIS — M5416 Radiculopathy, lumbar region: Secondary | ICD-10-CM | POA: Diagnosis not present

## 2018-03-02 NOTE — Assessment & Plan Note (Signed)
Reviewed CSRS No issues

## 2018-03-02 NOTE — Progress Notes (Signed)
Subjective:    Patient ID: Jeffrey Mcbride, male    DOB: 1955-06-14, 62 y.o.   MRN: [id number]  HPI Here for follow up of chronic pain and narcotic dependence  Has been going out with some deer hunter Uses rifle Back has been okay--for him  Doing okay with the current medications  Current Outpatient Medications on File Prior to Visit  Medication Sig Dispense Refill  . acetaminophen (TYLENOL) 500 MG tablet Take 250 mg by mouth every 6 (six) hours as needed.    . gabapentin (NEURONTIN) 100 MG capsule Take 1 capsule (100 mg total) by mouth 3 (three) times daily. 90 capsule 11  . ibuprofen (ADVIL,MOTRIN) 200 MG tablet Take 200 mg by mouth 3 (three) times daily.    Marland Kitchen oxyCODONE-acetaminophen (ROXICET) 5-325 MG tablet Take 1 tablet by mouth every 8 (eight) hours as needed for severe pain. A few days early---leaving town tomorrow 90 tablet 0   No current facility-administered medications on file prior to visit.     Allergies  Allergen Reactions  . Motrin [Ibuprofen] Other (See Comments)    High doses gives pt gas  . Naprosyn [Naproxen] Other (See Comments)    Elevated blood pressure    Past Medical History:  Diagnosis Date  . Allergic rhinitis due to pollen   . Back pain   . Hypertension   . Insomnia   . Lumbar back pain with radiculopathy affecting left lower extremity     Past Surgical History:  Procedure Laterality Date  . BACK SURGERY    . [place] SURGERY  2004  . LUMBAR FUSION     Dr Ronnald Ramp  . VASECTOMY      Family History  Problem Relation Age of Onset  . Heart disease Mother   . Hyperlipidemia Mother   . Heart disease Father   . Hyperlipidemia Father   . Alcohol abuse Sister   . Esophageal cancer Neg Hx   . Colon cancer Neg Hx   . Rectal cancer Neg Hx   . Stomach cancer Neg Hx     Social History   Socioeconomic History  . Marital status: Married    Spouse name: Not on file  . Number of children: 3  . Years of education: Not on file  .  Highest education level: Not on file  Occupational History  . Occupation: HVAC--disabled  Social Needs  . Financial resource strain: Not on file  . Food insecurity:    Worry: Not on file    Inability: Not on file  . Transportation needs:    Medical: Not on file    Non-medical: Not on file  Tobacco Use  . Smoking status: Never Smoker  . Smokeless tobacco: Never Used  Substance and Sexual Activity  . Alcohol use: Yes    Alcohol/week: 0.0 standard drinks    Comment: "very little"  . Drug use: No  . Sexual activity: Not on file  Lifestyle  . Physical activity:    Days per week: Not on file    Minutes per session: Not on file  . Stress: Not on file  Relationships  . Social connections:    Talks on phone: Not on file    Gets together: Not on file    Attends religious service: Not on file    Active member of club or organization: Not on file    Attends meetings of clubs or organizations: Not on file    Relationship status: Not on file  .  Intimate partner violence:    Fear of current or ex partner: Not on file    Emotionally abused: Not on file    Physically abused: Not on file    Forced sexual activity: Not on file  Other Topics Concern  . Not on file  Social History Narrative   3 daughters   Disabled due to back problems   Caregiver for wife-- and helps with multiple grandchildren      No living will   Would want wife to make decisions---alternate daughter Jeffrey Mcbride   Would accept resuscitation attempts   Probably wouldn't want tube feeds if cognitively unaware   Review of Systems Cut lip in sleep---?bit it Sleeping okay--but some problems (will use gabapentin at times &/or antihistamine) Appetite is fine     Objective:   Physical Exam  Constitutional: He appears well-developed. No distress.  Psychiatric: He has a normal mood and affect. His behavior is normal.           Assessment & Plan:

## 2018-03-02 NOTE — Assessment & Plan Note (Signed)
Doing okay Trying to stay active Same medication dosing

## 2018-03-03 ENCOUNTER — Other Ambulatory Visit: Payer: Self-pay

## 2018-03-03 MED ORDER — OXYCODONE-ACETAMINOPHEN 5-325 MG PO TABS: 1.0000 | ORAL_TABLET | Freq: Three times a day (TID) | ORAL | 0 refills | Status: DC | PRN

## 2018-03-03 NOTE — Telephone Encounter (Signed)
Name of Medication: oxycodone apap 5-325 mg Name of [facility] [place] Date and Quantity:#90 on 02/03/18  Last Office Visit and Type: 03/03/18 for 3 mth narcotic FU Next Office Visit and Type: 06/15/18 for 3 mth narcoticFU Last Controlled Substance Agreement Date: 05/19/17 Last UDS:05/19/17  Pt has enough med for 03/04/18 and would like to pick up rx on 03/04/18.

## 2018-03-31 ENCOUNTER — Other Ambulatory Visit: Payer: Self-pay | Admitting: *Deleted

## 2018-03-31 MED ORDER — OXYCODONE-ACETAMINOPHEN 5-325 MG PO TABS: 1.0000 | ORAL_TABLET | Freq: Three times a day (TID) | ORAL | 0 refills | Status: DC | PRN

## 2018-03-31 NOTE — Telephone Encounter (Signed)
Name of Medication: Oxycodone Name of Pharmacy: [facility] or Written Date and Quantity: 03-03-18 #90 Last Office Visit and Type: CPE 03-02-18 Next Office Visit and Type: # Month F/U 06-15-18 Last Controlled Substance Agreement Date: 05-19-17 Last UDS: 05-19-17

## 2018-05-02 ENCOUNTER — Other Ambulatory Visit: Payer: Self-pay | Admitting: *Deleted

## 2018-05-02 MED ORDER — OXYCODONE-ACETAMINOPHEN 5-325 MG PO TABS: 1.0000 | ORAL_TABLET | Freq: Three times a day (TID) | ORAL | 0 refills | Status: DC | PRN

## 2018-05-02 NOTE — Telephone Encounter (Signed)
Name of Medication: Oxycodone Name of Pharmacy: [facility] [place] or Written Date and Quantity: 03/31/18 #90 Last Office Visit and Type: CPE 03-02-18 Next Office Visit and Type: # Month F/U 06-15-18 Last Controlled Substance Agreement Date: 05-19-17 Last UDS: 05-19-17

## 2018-06-02 ENCOUNTER — Other Ambulatory Visit: Payer: Self-pay

## 2018-06-02 MED ORDER — OXYCODONE-ACETAMINOPHEN 5-325 MG PO TABS: 1.0000 | ORAL_TABLET | Freq: Three times a day (TID) | ORAL | 0 refills | Status: DC | PRN

## 2018-06-02 NOTE — Telephone Encounter (Signed)
Name of Medication: oxycodone apap 5-325 Name of Pharmacy: [facility] [place] or Written Date and Quantity:# 34 on 05/02/18  Last Office Visit and Type: 03/02/18 3 mth FU Next Office Visit and Type: 06/15/18 3 mth FU Last Controlled Substance Agreement Date:05/19/17  Last UDS:05/19/17

## 2018-06-14 ENCOUNTER — Telehealth: Payer: Self-pay

## 2018-06-14 NOTE — Telephone Encounter (Signed)
Tried to call pt to see if I could move up his 1045 appt tomorrow to 815 or 830. No answer and no voice mail.

## 2018-06-15 ENCOUNTER — Ambulatory Visit: Payer: Medicare Other | Admitting: Internal Medicine

## 2018-07-18 ENCOUNTER — Encounter: Payer: Self-pay | Admitting: Internal Medicine

## 2018-07-18 ENCOUNTER — Ambulatory Visit (INDEPENDENT_AMBULATORY_CARE_PROVIDER_SITE_OTHER): Payer: Medicare Other | Admitting: Internal Medicine

## 2018-07-18 VITALS — Ht 68.0 in | Wt 156.0 lb

## 2018-07-18 DIAGNOSIS — M5416 Radiculopathy, lumbar region: Secondary | ICD-10-CM | POA: Diagnosis not present

## 2018-07-18 DIAGNOSIS — F1120 Opioid dependence, uncomplicated: Secondary | ICD-10-CM | POA: Diagnosis not present

## 2018-07-18 DIAGNOSIS — I10 Essential (primary) hypertension: Secondary | ICD-10-CM | POA: Diagnosis not present

## 2018-07-18 MED ORDER — OXYCODONE-ACETAMINOPHEN 5-325 MG PO TABS: 1.0000 | ORAL_TABLET | Freq: Two times a day (BID) | ORAL | 0 refills | Status: DC | PRN

## 2018-07-18 NOTE — Assessment & Plan Note (Signed)
BP Readings from Last 3 Encounters:  03/02/18 138/76  12/01/17 136/80  08/20/17 120/70   Generally okay Up a little now (143/90) but would not add medications for this as yet

## 2018-07-18 NOTE — Progress Notes (Signed)
Subjective:    Patient ID: Jeffrey Mcbride, male    DOB: 24-Mar-1956, 63 y.o.   MRN: [id number]  HPI Virtual visit for review of chronic pain and narcotic dependence Identification done Reviewed billing and he gave consent He is in his home and I am in my office  He has checked his BP with his own cuff Was elevated at first Last 143/90 No chest pain or SOB No dizziness Some sinus symptoms with the pollen  Has tried to cut back on the medication for pain He has been riding his exercise bike---and he has had less pain Also bought firm PosturePedic mattress. Took a while to get used to it But now he feels he is better Has done some yard work---large garden (uses tiller, etc) Replacing engine on riding mower Has been very careful about safe lifting  Currently cuts the pills in quarters so making it last longer  Current Outpatient Medications on File Prior to Visit  Medication Sig Dispense Refill  . acetaminophen (TYLENOL) 500 MG tablet Take 250 mg by mouth every 6 (six) hours as needed.    Marland Kitchen ibuprofen (ADVIL,MOTRIN) 200 MG tablet Take 200 mg by mouth 3 (three) times daily.    Marland Kitchen oxyCODONE-acetaminophen (ROXICET) 5-325 MG tablet Take 1 tablet by mouth every 8 (eight) hours as needed for severe pain. A few days early---leaving town tomorrow 90 tablet 0  . gabapentin (NEURONTIN) 100 MG capsule Take 1 capsule (100 mg total) by mouth 3 (three) times daily. (Patient not taking: Reported on 07/18/2018) 90 capsule 11   No current facility-administered medications on file prior to visit.     Allergies  Allergen Reactions  . Motrin [Ibuprofen] Other (See Comments)    High doses gives pt gas  . Naprosyn [Naproxen] Other (See Comments)    Elevated blood pressure    Past Medical History:  Diagnosis Date  . Allergic rhinitis due to pollen   . Back pain   . Hypertension   . Insomnia   . Lumbar back pain with radiculopathy affecting left lower extremity     Past Surgical  History:  Procedure Laterality Date  . BACK SURGERY    . [place] SURGERY  2004  . LUMBAR FUSION     Dr Ronnald Ramp  . VASECTOMY      Family History  Problem Relation Age of Onset  . Heart disease Mother   . Hyperlipidemia Mother   . Heart disease Father   . Hyperlipidemia Father   . Alcohol abuse Sister   . Esophageal cancer Neg Hx   . Colon cancer Neg Hx   . Rectal cancer Neg Hx   . Stomach cancer Neg Hx     Social History   Socioeconomic History  . Marital status: Married    Spouse name: Not on file  . Number of children: 3  . Years of education: Not on file  . Highest education level: Not on file  Occupational History  . Occupation: HVAC--disabled  Social Needs  . Financial resource strain: Not on file  . Food insecurity:    Worry: Not on file    Inability: Not on file  . Transportation needs:    Medical: Not on file    Non-medical: Not on file  Tobacco Use  . Smoking status: Never Smoker  . Smokeless tobacco: Never Used  Substance and Sexual Activity  . Alcohol use: Yes    Alcohol/week: 0.0 standard drinks    Comment: "very little"  .  Drug use: No  . Sexual activity: Not on file  Lifestyle  . Physical activity:    Days per week: Not on file    Minutes per session: Not on file  . Stress: Not on file  Relationships  . Social connections:    Talks on phone: Not on file    Gets together: Not on file    Attends religious service: Not on file    Active member of club or organization: Not on file    Attends meetings of clubs or organizations: Not on file    Relationship status: Not on file  . Intimate partner violence:    Fear of current or ex partner: Not on file    Emotionally abused: Not on file    Physically abused: Not on file    Forced sexual activity: Not on file  Other Topics Concern  . Not on file  Social History Narrative   3 daughters   Disabled due to back problems   Caregiver for wife-- and helps with multiple grandchildren      No  living will   Would want wife to make decisions---alternate daughter Rise Paganini   Would accept resuscitation attempts   Probably wouldn't want tube feeds if cognitively unaware   Review of Systems He is staying home mostly Does see girls and grandkids at times---and goes out to get food Does bruise easily Had some numbness in left lip--wondered if it was related to his neck (discussed that it wouldn't be from his neck)    Objective:   Physical Exam  Constitutional: He appears well-developed.  Respiratory: Effort normal. No respiratory distress.  Psychiatric: He has a normal mood and affect. His behavior is normal.           Assessment & Plan:

## 2018-07-18 NOTE — Assessment & Plan Note (Signed)
Reviewed CSRS--no concerns 

## 2018-07-18 NOTE — Assessment & Plan Note (Signed)
Has actually improved some in the past few weeks Using less of the oxycodone---will reduce it to bid prn at his request Doesn't even need refill as yet

## 2018-08-10 ENCOUNTER — Other Ambulatory Visit: Payer: Self-pay

## 2018-08-10 NOTE — Telephone Encounter (Signed)
Name of Medication: oxycodone apap 5-325 mg Name of Pharmacy: [facility] or Written Date and Quantity:# 60 on 07/18/18 Last Office Visit and Type:07/18/18 3 mth FU pain mgt  Next Office Visit and Type:10/18/18 CPX  Last Controlled Substance Agreement Date:05/19/17  Last UDS:05/19/17  In V/M pt request to get medication by 08/12/18.   -

## 2018-08-11 MED ORDER — OXYCODONE-ACETAMINOPHEN 5-325 MG PO TABS: 1.0000 | ORAL_TABLET | Freq: Two times a day (BID) | ORAL | 0 refills | Status: DC | PRN

## 2018-09-14 ENCOUNTER — Other Ambulatory Visit: Payer: Self-pay | Admitting: *Deleted

## 2018-09-14 NOTE — Telephone Encounter (Signed)
Patient left a voicemail requesting a refill  Name of Medication: Oxycodone [facility] Last Fill or Written Date and Quantity: 08/11/18 #60 Last Office Visit and Type: 07/18/18 pain management Next Office Visit and Type: 10/18/18 CPX Last Controlled Substance Agreement Date: 05/19/17 Last UDS 05/19/17

## 2018-09-15 MED ORDER — OXYCODONE-ACETAMINOPHEN 5-325 MG PO TABS: 1.0000 | ORAL_TABLET | Freq: Two times a day (BID) | ORAL | 0 refills | Status: DC | PRN

## 2018-10-13 ENCOUNTER — Telehealth: Payer: Self-pay | Admitting: Internal Medicine

## 2018-10-13 MED ORDER — OXYCODONE-ACETAMINOPHEN 5-325 MG PO TABS: 1.0000 | ORAL_TABLET | Freq: Two times a day (BID) | ORAL | 0 refills | Status: DC | PRN

## 2018-10-13 NOTE — Telephone Encounter (Signed)
Pt is requesting a refill of [facility] [place].

## 2018-10-13 NOTE — Addendum Note (Signed)
Addended by: Viviana Simpler I on: 10/13/2018 01:46 PM   Modules accepted: Orders

## 2018-10-13 NOTE — Telephone Encounter (Signed)
Name of Medication: Oxycodone Name of Pharmacy: [facility] or Written Date and Quantity: 09-15-18 #60 Last Office Visit and Type: 3 month f/u 07-18-18 Next Office Visit and Type: 3 Month F/U 10-18-18 Last Controlled Substance Agreement Date: 05-19-17 Last UDS: 05-19-17

## 2018-10-18 ENCOUNTER — Encounter: Payer: Self-pay | Admitting: Internal Medicine

## 2018-10-18 ENCOUNTER — Ambulatory Visit (INDEPENDENT_AMBULATORY_CARE_PROVIDER_SITE_OTHER): Payer: Medicare Other | Admitting: Internal Medicine

## 2018-10-18 ENCOUNTER — Other Ambulatory Visit: Payer: Self-pay

## 2018-10-18 VITALS — BP 138/82 | HR 47 | Temp 98.0°F | Ht 68.0 in | Wt 152.0 lb

## 2018-10-18 DIAGNOSIS — Z125 Encounter for screening for malignant neoplasm of prostate: Secondary | ICD-10-CM

## 2018-10-18 DIAGNOSIS — I499 Cardiac arrhythmia, unspecified: Secondary | ICD-10-CM

## 2018-10-18 DIAGNOSIS — F1120 Opioid dependence, uncomplicated: Secondary | ICD-10-CM | POA: Diagnosis not present

## 2018-10-18 DIAGNOSIS — M5416 Radiculopathy, lumbar region: Secondary | ICD-10-CM | POA: Diagnosis not present

## 2018-10-18 DIAGNOSIS — Z7189 Other specified counseling: Secondary | ICD-10-CM

## 2018-10-18 DIAGNOSIS — Z0000 Encounter for general adult medical examination without abnormal findings: Secondary | ICD-10-CM | POA: Diagnosis not present

## 2018-10-18 DIAGNOSIS — I10 Essential (primary) hypertension: Secondary | ICD-10-CM | POA: Diagnosis not present

## 2018-10-18 LAB — T4, FREE: Free T4: 0.91 ng/dL (ref 0.60–1.60)

## 2018-10-18 LAB — COMPREHENSIVE METABOLIC PANEL
ALT: 18 U/L (ref 0–53)
AST: 19 U/L (ref 0–37)
Albumin: 5.2 g/dL (ref 3.5–5.2)
Alkaline Phosphatase: 87 U/L (ref 39–117)
BUN: 19 mg/dL (ref 6–23)
CO2: 28 mEq/L (ref 19–32)
Calcium: 9.9 mg/dL (ref 8.4–10.5)
Chloride: 102 mEq/L (ref 96–112)
Creatinine, Ser: 1.28 mg/dL (ref 0.40–1.50)
GFR: 56.43 mL/min — ABNORMAL LOW (ref >60.00)
Glucose, Bld: 90 mg/dL (ref 70–99)
Potassium: 4.1 mEq/L (ref 3.5–5.1)
Sodium: 140 mEq/L (ref 135–145)
Total Bilirubin: 0.6 mg/dL (ref 0.2–1.2)
Total Protein: 7.7 g/dL (ref 6.0–8.3)

## 2018-10-18 LAB — CBC
HCT: 44.5 % (ref 39.0–52.0)
Hemoglobin: 14.8 g/dL (ref 13.0–17.0)
MCHC: 33.2 g/dL (ref 30.0–36.0)
MCV: 93.1 fl (ref 78.0–100.0)
Platelets: 300 10*3/uL (ref 150.0–400.0)
RBC: 4.78 Mil/uL (ref 4.22–5.81)
RDW: 13.8 % (ref 11.5–15.5)
WBC: 9.7 10*3/uL (ref 4.0–10.5)

## 2018-10-18 LAB — PSA: PSA: 1.48 ng/mL (ref 0.10–4.00)

## 2018-10-18 NOTE — Assessment & Plan Note (Signed)
I have personally reviewed the Medicare Annual Wellness questionnaire and have noted 1. The patient's medical and social history 2. Their use of alcohol, tobacco or illicit drugs 3. Their current medications and supplements 4. The patient's functional ability including ADL's, fall risks, home safety risks and hearing or visual             impairment. 5. Diet and physical activities 6. Evidence for depression or mood disorders  The patients weight, height, BMI and visual acuity have been recorded in the chart I have made referrals, counseling and provided education to the patient based review of the above and I have provided the pt with a written personalized care plan for preventive services.  I have provided you with a copy of your personalized plan for preventive services. Please take the time to review along with your updated medication list.  Still prefers no flu vaccine--discussed He is willing to check PSA Colon due 2023 Discussed fitness

## 2018-10-18 NOTE — Assessment & Plan Note (Signed)
Chronic pain but some better lately

## 2018-10-18 NOTE — Assessment & Plan Note (Addendum)
Sounds like atrial fib Will check EKG  EKG shows sinus bradycardia at 47. Normal axis and intervals. No ischemic changes. Since May 2016, the rate is lower  I must have heard PACs inbetween his regular beats No action needed

## 2018-10-18 NOTE — Assessment & Plan Note (Signed)
See social history 

## 2018-10-18 NOTE — Progress Notes (Signed)
Subjective:    Patient ID: Jeffrey Mcbride, male    DOB: April 03, 1955, 63 y.o.   MRN: [id number]  HPI Here for Medicare wellness and follow up of chronic health conditions Reviewed form and advanced directives Reviewed other doctors No alcohol or tobacco Tries to walk and stay active--- walking on cars/lawn mowers, mows, etc Now has daughter's [place] Shepherd--keeping them up at night Vision and hearing are okay No falls--but will have to reach out to grab something if he gets off balance No regular depression---annoyed by the chronic pain at times. Not anhedonic Independent with instrumental ADLs No sig problems with memory  Ongoing chronic back pain Continues on the oxycodone--trying to reduce the use Reviewed PDMP Has some foot pain also--and stubbed toe Some trouble swaying when walking from hips being stiff Knows he has some leg length discrepancy--discussed trying foot lift  Has stayed at home for the most part Being careful with COVID  No chest pain No SOB Gets sinus symptoms and some congestion (coming into the cool house after being in the heat, etc) No edema--just some swelling around lateral malleolus of left ankle (pops chronically) No palpitations  Current Outpatient Medications on File Prior to Visit  Medication Sig Dispense Refill  . acetaminophen (TYLENOL) 500 MG tablet Take 250 mg by mouth every 6 (six) hours as needed.    . gabapentin (NEURONTIN) 100 MG capsule Take 1 capsule (100 mg total) by mouth 3 (three) times daily. 90 capsule 11  . ibuprofen (ADVIL,MOTRIN) 200 MG tablet Take 200 mg by mouth 3 (three) times daily.    Marland Kitchen oxyCODONE-acetaminophen (ROXICET) 5-325 MG tablet Take 1 tablet by mouth 2 (two) times daily as needed for severe pain. 60 tablet 0   No current facility-administered medications on file prior to visit.     Allergies  Allergen Reactions  . Motrin [Ibuprofen] Other (See Comments)    High doses gives pt gas  . Naprosyn  [Naproxen] Other (See Comments)    Elevated blood pressure    Past Medical History:  Diagnosis Date  . Allergic rhinitis due to pollen   . Back pain   . Hypertension   . Insomnia   . Lumbar back pain with radiculopathy affecting left lower extremity     Past Surgical History:  Procedure Laterality Date  . BACK SURGERY    . [place] SURGERY  2004  . LUMBAR FUSION     Dr Ronnald Ramp  . VASECTOMY      Family History  Problem Relation Age of Onset  . Heart disease Mother   . Hyperlipidemia Mother   . Heart disease Father   . Hyperlipidemia Father   . Alcohol abuse Sister   . Breast cancer Daughter   . Esophageal cancer Neg Hx   . Colon cancer Neg Hx   . Rectal cancer Neg Hx   . Stomach cancer Neg Hx     Social History   Socioeconomic History  . Marital status: Married    Spouse name: Not on file  . Number of children: 3  . Years of education: Not on file  . Highest education level: Not on file  Occupational History  . Occupation: HVAC--disabled  Social Needs  . Financial resource strain: Not on file  . Food insecurity    Worry: Not on file    Inability: Not on file  . Transportation needs    Medical: Not on file    Non-medical: Not on file  Tobacco Use  .  Smoking status: Never Smoker  . Smokeless tobacco: Never Used  Substance and Sexual Activity  . Alcohol use: Yes    Alcohol/week: 0.0 standard drinks    Comment: "very little"  . Drug use: No  . Sexual activity: Not on file  Lifestyle  . Physical activity    Days per week: Not on file    Minutes per session: Not on file  . Stress: Not on file  Relationships  . Social Herbalist on phone: Not on file    Gets together: Not on file    Attends religious service: Not on file    Active member of club or organization: Not on file    Attends meetings of clubs or organizations: Not on file    Relationship status: Not on file  . Intimate partner violence    Fear of current or ex partner: Not on  file    Emotionally abused: Not on file    Physically abused: Not on file    Forced sexual activity: Not on file  Other Topics Concern  . Not on file  Social History Narrative   3 daughters   Disabled due to back problems   Caregiver for wife-- and helps with multiple grandchildren      No living will   Would want wife to make decisions---alternate daughter Rise Paganini   Would accept resuscitation attempts   Probably wouldn't want tube feeds if cognitively unaware   Review of Systems Appetite is okay--down in the hot weather Weight is down slightly for the summer Sleep is a chronic issue---light sleeper. Occasional daytime somnolence. No apnea Wears seat belt Teeth need some work--no dentist No suspicious skin lesions--but does note easy bruising No heartburn or dysphagia Some dry throat--relates to sinus medications Bowels are fine--no blood (other than on toilet paper at times from hemorrhoid) Voids okay     Objective:   Physical Exam  Constitutional: He is oriented to person, place, and time. He appears well-developed. No distress.  HENT:  Mouth/Throat: Oropharynx is clear and moist. No oropharyngeal exudate.  Neck: No thyromegaly present.  Cardiovascular: Normal rate, normal heart sounds and intact distal pulses. Exam reveals no gallop.  No murmur heard. irregular  Respiratory: Effort normal and breath sounds normal. No respiratory distress. He has no wheezes. He has no rales.  GI: Soft. There is no abdominal tenderness.  Musculoskeletal:        General: No tenderness or edema.  Lymphadenopathy:    He has no cervical adenopathy.  Neurological: He is alert and oriented to person, place, and time.  President--- "Daisy Floro, Obama, Bush" [phone] [id number] Recall 3/3  Skin: No rash noted. No erythema.  Psychiatric: He has a normal mood and affect. His behavior is normal.           Assessment & Plan:

## 2018-10-18 NOTE — Assessment & Plan Note (Signed)
BP Readings from Last 3 Encounters:  10/18/18 138/82  03/02/18 138/76  12/01/17 136/80   Okay without meds Just better lifestyle

## 2018-10-18 NOTE — Assessment & Plan Note (Signed)
No concerns with PDMP

## 2018-10-21 LAB — PAIN MGMT, PROFILE 8 W/CONF, U
6 Acetylmorphine: NEGATIVE ng/mL
Alcohol Metabolites: NEGATIVE ng/mL (ref <500)
Amphetamines: NEGATIVE ng/mL
Benzodiazepines: NEGATIVE ng/mL
Buprenorphine, Urine: NEGATIVE ng/mL
Cocaine Metabolite: NEGATIVE ng/mL
Codeine: NEGATIVE ng/mL
Creatinine: 106.4 mg/dL
Hydrocodone: NEGATIVE ng/mL
Hydromorphone: NEGATIVE ng/mL
MDMA: NEGATIVE ng/mL
Marijuana Metabolite: 876 ng/mL
Marijuana Metabolite: POSITIVE ng/mL
Morphine: NEGATIVE ng/mL
Norhydrocodone: NEGATIVE ng/mL
Noroxycodone: 932 ng/mL
Opiates: NEGATIVE ng/mL
Oxidant: NEGATIVE ug/mL
Oxycodone: 210 ng/mL
Oxycodone: POSITIVE ng/mL
Oxymorphone: 965 ng/mL
pH: 5.6 (ref 4.5–9.0)

## 2018-10-25 ENCOUNTER — Telehealth: Payer: Self-pay

## 2018-10-25 NOTE — Telephone Encounter (Signed)
Noted. Results mailed to the patient today.

## 2018-10-25 NOTE — Telephone Encounter (Signed)
Called patient about lab results but voicemail is not set up and not able to leave a message. Will try again later

## 2018-10-25 NOTE — Telephone Encounter (Signed)
Patient called back.  Patient said Dr.Letvak told him he would mail the results.  Patient said that it's fine to mail results to him unless Anastasiya needs to speak to him.

## 2018-11-03 ENCOUNTER — Telehealth: Payer: Self-pay | Admitting: Internal Medicine

## 2018-11-03 NOTE — Telephone Encounter (Signed)
Name of Medication: oxycodone apap 5-325 mg Name of Pharmacy:[facility]  Last Fill or Written Date and Quantity: # 12 on 10/13/18 Last Office Visit and Type: 10/18/18 medicare wellness Next Office Visit and Type:01/20/19 for 3 mth FU  Last Controlled Substance Agreement Date: 10/28/18 Last UDS:10/18/18

## 2018-11-03 NOTE — Telephone Encounter (Signed)
Please find out why he is asking for it so early

## 2018-11-03 NOTE — Telephone Encounter (Signed)
Best number [phone]  Pt called needing refill on oxycodone  [facility]  Pt has enough to last till friday

## 2018-11-04 NOTE — Telephone Encounter (Signed)
Pt called he has enough meds to last till tomorrow 8/8 he was just calling early to give you all time to refill

## 2018-11-04 NOTE — Telephone Encounter (Signed)
Tried to call pt. No answer and no vm to leave a message

## 2018-11-04 NOTE — Telephone Encounter (Signed)
Pt called he stated he is 1 week ahead on refill

## 2018-11-06 NOTE — Telephone Encounter (Signed)
Let him know that I, and the pharmacy, cannot fill it this early. Remind me for refill later in the week

## 2018-11-07 NOTE — Telephone Encounter (Signed)
Tried to call pt but he has no VM.

## 2018-11-10 MED ORDER — OXYCODONE-ACETAMINOPHEN 5-325 MG PO TABS: 1.0000 | ORAL_TABLET | Freq: Two times a day (BID) | ORAL | 0 refills | Status: DC | PRN

## 2018-11-10 NOTE — Telephone Encounter (Signed)
Name of Medication: oxycodone apap 5-325 mg Name of Pharmacy:[facility]  Last Fill or Written Date and Quantity: # 66 on 10/13/18 Last Office Visit and Type: 10/18/18 medicare wellness Next Office Visit and Type:01/20/19 for 3 mth FU  Last Controlled Substance Agreement Date: 10/28/18 Last UDS:10/18/18

## 2018-11-10 NOTE — Telephone Encounter (Signed)
Patient apologized that he called too early last week he was confused.  Patient's due for medication on Sunday.

## 2018-12-08 ENCOUNTER — Other Ambulatory Visit: Payer: Self-pay | Admitting: Internal Medicine

## 2018-12-08 MED ORDER — OXYCODONE-ACETAMINOPHEN 5-325 MG PO TABS: 1.0000 | ORAL_TABLET | Freq: Two times a day (BID) | ORAL | 0 refills | Status: DC | PRN

## 2018-12-08 NOTE — Telephone Encounter (Signed)
Patient needs a refill on Oxycodone.  [facility] uses [facility].

## 2018-12-08 NOTE — Telephone Encounter (Signed)
Name of Medication: oxycodone apap 5-325 mg Name of Pharmacy: [facility] or Written Date and Quantity: # 5 on 11/10/18 Last Office Visit and Type: 10/18/18 medicare wellness Next Office Visit and Type: 01/20/19 for 3 mth FU Last Controlled Substance Agreement Date:10/28/18  Last UDS:10/18/18

## 2019-01-05 ENCOUNTER — Other Ambulatory Visit: Payer: Self-pay | Admitting: Internal Medicine

## 2019-01-05 MED ORDER — OXYCODONE-ACETAMINOPHEN 5-325 MG PO TABS: 1.0000 | ORAL_TABLET | Freq: Two times a day (BID) | ORAL | 0 refills | Status: DC | PRN

## 2019-01-05 NOTE — Telephone Encounter (Signed)
Name of Medication: Oxycodone Name of Pharmacy: [facility] Last Written Date and Quantity: 12/08/18 #60 Last Office Visit and Type: 10/18/18 Next Office Visit and Type: 01/20/19 Last Controlled Substance Agreement Date: 10/18/18 Last UDS: 10/18/18  If it was written and filled 12/08/18, he should have more than 1 pill left, correct?

## 2019-01-05 NOTE — Telephone Encounter (Signed)
Best number [phone]  Pt called to get a refill on  Oxycodone  [facility]  Pt has 1 pill left

## 2019-01-20 ENCOUNTER — Other Ambulatory Visit: Payer: Self-pay

## 2019-01-20 ENCOUNTER — Encounter: Payer: Self-pay | Admitting: Internal Medicine

## 2019-01-20 ENCOUNTER — Ambulatory Visit (INDEPENDENT_AMBULATORY_CARE_PROVIDER_SITE_OTHER): Payer: Medicare Other | Admitting: Internal Medicine

## 2019-01-20 DIAGNOSIS — M5416 Radiculopathy, lumbar region: Secondary | ICD-10-CM | POA: Diagnosis not present

## 2019-01-20 DIAGNOSIS — F1120 Opioid dependence, uncomplicated: Secondary | ICD-10-CM

## 2019-01-20 MED ORDER — OXYCODONE-ACETAMINOPHEN 5-325 MG PO TABS: 1.0000 | ORAL_TABLET | Freq: Three times a day (TID) | ORAL | 0 refills | Status: DC | PRN

## 2019-01-20 MED ORDER — TIZANIDINE HCL 4 MG PO TABS: 4.0000 mg | ORAL_TABLET | Freq: Every evening | ORAL | 3 refills | Status: DC | PRN

## 2019-01-20 NOTE — Assessment & Plan Note (Signed)
PDMP reviewed No concerns 

## 2019-01-20 NOTE — Assessment & Plan Note (Signed)
Chronic pain Sensory component as well He tries to limit pain meds till he gets "irritable" and the pain gets worse Discussed all options Will increase hydrocodone to tid prn (but only #75)

## 2019-01-20 NOTE — Progress Notes (Signed)
Subjective:    Patient ID: Jeffrey Mcbride, male    DOB: [birth year], 63 y.o.   MRN: [id number]  HPI Here for follow up of chronic pain and narcotic dependence He feels his back pain "has creeped back up" Has had trouble stretching his meds out----cut them in 1/4's and tries to spread them out  Does try tylenol and motrin as well to decrease the narcotic use  Did used to use methocarbamol at night Got from [place] and it helped Discussed trying tizanidine instead as a safer alternative  Gets throbbing and aching in his feet at times  Tries to stay active---regular activity but tries not to overdo it [employer], building enclosure for dog, etc  Current Outpatient Medications on File Prior to Visit  Medication Sig Dispense Refill  . acetaminophen (TYLENOL) 500 MG tablet Take 250 mg by mouth every 6 (six) hours as needed.    . gabapentin (NEURONTIN) 100 MG capsule Take 1 capsule (100 mg total) by mouth 3 (three) times daily. 90 capsule 11  . ibuprofen (ADVIL,MOTRIN) 200 MG tablet Take 200 mg by mouth 3 (three) times daily.    Marland Kitchen oxyCODONE-acetaminophen (ROXICET) 5-325 MG tablet Take 1 tablet by mouth 2 (two) times daily as needed for severe pain. 60 tablet 0   No current facility-administered medications on file prior to visit.     Allergies  Allergen Reactions  . Motrin [Ibuprofen] Other (See Comments)    High doses gives pt gas  . Naprosyn [Naproxen] Other (See Comments)    Elevated blood pressure    Past Medical History:  Diagnosis Date  . Allergic rhinitis due to pollen   . Back pain   . Hypertension   . Insomnia   . Lumbar back pain with radiculopathy affecting left lower extremity     Past Surgical History:  Procedure Laterality Date  . BACK SURGERY    . [place] SURGERY  2004  . LUMBAR FUSION     Dr Ronnald Ramp  . VASECTOMY      Family History  Problem Relation Age of Onset  . Heart disease Mother   . Hyperlipidemia Mother   . Heart disease Father   .  Hyperlipidemia Father   . Alcohol abuse Sister   . Breast cancer Daughter   . Esophageal cancer Neg Hx   . Colon cancer Neg Hx   . Rectal cancer Neg Hx   . Stomach cancer Neg Hx     Social History   Socioeconomic History  . Marital status: Married    Spouse name: Not on file  . Number of children: 3  . Years of education: Not on file  . Highest education level: Not on file  Occupational History  . Occupation: HVAC--disabled  Social Needs  . Financial resource strain: Not on file  . Food insecurity    Worry: Not on file    Inability: Not on file  . Transportation needs    Medical: Not on file    Non-medical: Not on file  Tobacco Use  . Smoking status: Never Smoker  . Smokeless tobacco: Never Used  Substance and Sexual Activity  . Alcohol use: Yes    Alcohol/week: 0.0 standard drinks    Comment: "very little"  . Drug use: No  . Sexual activity: Not on file  Lifestyle  . Physical activity    Days per week: Not on file    Minutes per session: Not on file  . Stress: Not on file  Relationships  . Social Herbalist on phone: Not on file    Gets together: Not on file    Attends religious service: Not on file    Active member of club or organization: Not on file    Attends meetings of clubs or organizations: Not on file    Relationship status: Not on file  . Intimate partner violence    Fear of current or ex partner: Not on file    Emotionally abused: Not on file    Physically abused: Not on file    Forced sexual activity: Not on file  Other Topics Concern  . Not on file  Social History Narrative   3 daughters   Disabled due to back problems   Caregiver for wife-- and helps with multiple grandchildren      No living will   Would want wife to make decisions---alternate daughter Rise Paganini   Would accept resuscitation attempts   Probably wouldn't want tube feeds if cognitively unaware   Wants to donate any organs possible   Review of Systems  Sleep is  variable--awakens to void 2-3 times per night Awakens due to pain at times Appetite is pretty good Weight is stable     Objective:   Physical Exam  Constitutional: He appears well-developed. No distress.  Psychiatric: He has a normal mood and affect. His behavior is normal.           Assessment & Plan:

## 2019-02-02 ENCOUNTER — Other Ambulatory Visit: Payer: Self-pay | Admitting: Internal Medicine

## 2019-02-02 NOTE — Telephone Encounter (Signed)
Tried to call pt back but his VM is full. Needing to make sure he picked up the rx from 01-20-19. If so, how many is he taking a day?

## 2019-02-02 NOTE — Telephone Encounter (Signed)
Name of Medication: Oxycodone Name of Pharmacy: [facility] or Written Date and Quantity: 01-20-19 #75 Last Office Visit and Type: 01-20-19 Next Office Visit and Type: 04-25-18 Last Controlled Substance Agreement Date: 10-18-18 Last UDS: 10-18-18  Tried to call pt but call was disconnected.

## 2019-02-02 NOTE — Telephone Encounter (Signed)
Pt called to get a refill on  Oxycodone Pt Is out of his meds  [facility]

## 2019-02-03 MED ORDER — OXYCODONE-ACETAMINOPHEN 5-325 MG PO TABS: 1.0000 | ORAL_TABLET | Freq: Three times a day (TID) | ORAL | 0 refills | Status: DC | PRN

## 2019-02-03 NOTE — Telephone Encounter (Signed)
Tried to call pt, again. VM is still full.

## 2019-02-03 NOTE — Telephone Encounter (Signed)
According to PMP, he has not picked up the 01-20-19 rx. If pt calls back, please advise him of this. Thanks

## 2019-02-03 NOTE — Telephone Encounter (Signed)
Patient didn't realize he had a rx done on 01/20/19. Patient's going to call the pharmacy. If there's any problems, he'll call back.

## 2019-02-03 NOTE — Addendum Note (Signed)
Addended by: Viviana Simpler I on: 02/03/2019 12:45 PM   Modules accepted: Orders

## 2019-02-03 NOTE — Telephone Encounter (Signed)
Looking at it now, it may not have been done, just set up to be done. Message sent to Dr Silvio Pate.

## 2019-02-03 NOTE — Addendum Note (Signed)
Addended by: Pilar Grammes on: 02/03/2019 12:03 PM   Modules accepted: Orders

## 2019-02-03 NOTE — Telephone Encounter (Signed)
Patient stated that he spoke with the pharmacy and they advised patient that they did not receive the prescription for OXYCODONE  . But they did receive the Tizanidine    He would like the OXYCODONE sent whenever possible

## 2019-03-02 ENCOUNTER — Other Ambulatory Visit: Payer: Self-pay | Admitting: Internal Medicine

## 2019-03-02 MED ORDER — OXYCODONE-ACETAMINOPHEN 5-325 MG PO TABS: 1.0000 | ORAL_TABLET | Freq: Three times a day (TID) | ORAL | 0 refills | Status: DC | PRN

## 2019-03-02 NOTE — Telephone Encounter (Signed)
Patient is requesting a refill  OXYCODONE  Patient has 1 tablet left   [facility] [address] [employer]

## 2019-03-02 NOTE — Telephone Encounter (Signed)
Name of Medication: oxycodone apap 5-325 mg Name of Pharmacy:[facility]  Last Fill or Written Date and Quantity: #75 on 02/03/19 Last Office Visit and Type: 01/20/19 FU pain mgt Next Office Visit and Type: 04/26/19 Last Controlled Substance Agreement Date: 10/28/18 Last UDS:10/18/18

## 2019-04-03 ENCOUNTER — Other Ambulatory Visit: Payer: Self-pay | Admitting: Internal Medicine

## 2019-04-03 MED ORDER — OXYCODONE-ACETAMINOPHEN 5-325 MG PO TABS: 1.0000 | ORAL_TABLET | Freq: Three times a day (TID) | ORAL | 0 refills | Status: DC | PRN

## 2019-04-03 NOTE — Telephone Encounter (Signed)
Patient is requesting a refill  OXYCODONE  Patient stated he was completely out of medication   [facility]

## 2019-04-03 NOTE — Telephone Encounter (Signed)
Name of Medication: oxycodone apap 5-325 mg Name of Pharmacy:[facility]  Last Fill or Written Date and Quantity: #75 on 03/02/19 Last Office Visit and Type: 01/20/19 for 3 mth FU Next Office Visit and Type: 04/26/2019 for 3 mth FU Last Controlled Substance Agreement Date: 10/28/18 Last UDS:10/18/18  Pt is out of medication.

## 2019-04-26 ENCOUNTER — Ambulatory Visit (INDEPENDENT_AMBULATORY_CARE_PROVIDER_SITE_OTHER): Payer: Medicare Other | Admitting: Internal Medicine

## 2019-04-26 ENCOUNTER — Other Ambulatory Visit: Payer: Self-pay

## 2019-04-26 ENCOUNTER — Encounter: Payer: Self-pay | Admitting: Internal Medicine

## 2019-04-26 VITALS — BP 122/66 | HR 59 | Temp 97.3°F | Ht 68.0 in | Wt 157.0 lb

## 2019-04-26 DIAGNOSIS — H918X9 Other specified hearing loss, unspecified ear: Secondary | ICD-10-CM | POA: Diagnosis not present

## 2019-04-26 DIAGNOSIS — H9190 Unspecified hearing loss, unspecified ear: Secondary | ICD-10-CM | POA: Insufficient documentation

## 2019-04-26 DIAGNOSIS — M5416 Radiculopathy, lumbar region: Secondary | ICD-10-CM | POA: Diagnosis not present

## 2019-04-26 DIAGNOSIS — F1120 Opioid dependence, uncomplicated: Secondary | ICD-10-CM

## 2019-04-26 NOTE — Progress Notes (Signed)
Subjective:    Patient ID: Jeffrey Mcbride, male    DOB: [birth year], 64 y.o.   MRN: [id number]  HPI Here for follow up of chronic back pain and narcotic dependence This visit occurred during the SARS-CoV-2 public health emergency.  Safety protocols were in place, including screening questions prior to the visit, additional usage of staff PPE, and extensive cleaning of exam room while observing appropriate contact time as indicated for disinfecting solutions.   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 acetaminophen (TYLENOL) 500 MG tablet Take 250 mg by mouth every 6 (six) hours as needed.    . gabapentin (NEURONTIN) 100 MG capsule Take 1 capsule (100 mg total) by mouth 3 (three) times daily. 90 capsule 11  . ibuprofen (ADVIL,MOTRIN) 200 MG tablet Take 200 mg by mouth 3 (three) times daily.    Marland Kitchen oxyCODONE-acetaminophen (ROXICET) 5-325 MG tablet Take 1 tablet by mouth 3 (three) times daily as needed for severe pain. 75 tablet 0  . tiZANidine (ZANAFLEX) 4 MG tablet Take 1 tablet (4 mg total) by mouth at bedtime as needed for muscle spasms. 30 tablet 3   No current facility-administered medications on file prior to visit.    Allergies  Allergen Reactions  . Motrin [Ibuprofen] Other (See Comments)    High doses gives pt gas  . Naprosyn [Naproxen] Other (See Comments)    Elevated blood pressure    Past Medical History:  Diagnosis Date  . Allergic rhinitis due to pollen   . Back pain   . Hypertension   .  Insomnia   . Lumbar back pain with radiculopathy affecting left lower extremity     Past Surgical History:  Procedure Laterality Date  . BACK SURGERY    . [place] SURGERY  2004  . LUMBAR FUSION     Dr Ronnald Ramp  . VASECTOMY      Family History  Problem Relation Age of Onset  . Heart disease Mother   . Hyperlipidemia Mother   . Heart disease Father   . Hyperlipidemia Father   . Alcohol abuse Sister   . Breast cancer Daughter   . Esophageal cancer Neg Hx   . Colon cancer Neg Hx   . Rectal cancer Neg Hx   . Stomach cancer Neg Hx     Social History   Socioeconomic History  . Marital status: Married    Spouse name: Not on file  . Number of children: 3  . Years of education: Not on file  . Highest education level: Not on file  Occupational History  . Occupation: HVAC--disabled  Tobacco Use  . Smoking status: Never Smoker  . Smokeless tobacco: Never Used  Substance and Sexual Activity  . Alcohol use: Yes    Alcohol/week: 0.0 standard drinks    Comment: "very little"  . Drug use: No  . Sexual activity: Not on file  Other Topics Concern  . Not on file  Social History Narrative   3 daughters   Disabled due to back  problems   Caregiver for wife-- and helps with multiple grandchildren      No living will   Would want wife to make decisions---alternate daughter Rise Paganini   Would accept resuscitation attempts   Probably wouldn't want tube feeds if cognitively unaware   Wants to donate any organs possible   Social Determinants of Health   Financial Resource Strain:   . Difficulty of Paying Living Expenses: Not on file  Food Insecurity:   . Worried About Charity fundraiser in the Last Year: Not on file  . Ran Out of Food in the Last Year: Not on file  Transportation Needs:   . Lack of Transportation (Medical): Not on file  . Lack of Transportation (Non-Medical): Not on file  Physical Activity:   . Days of Exercise per Week: Not on file  . Minutes of Exercise per  Session: Not on file  Stress:   . Feeling of Stress : Not on file  Social Connections:   . Frequency of Communication with Friends and Family: Not on file  . Frequency of Social Gatherings with Friends and Family: Not on file  . Attends Religious Services: Not on file  . Active Member of Clubs or Organizations: Not on file  . Attends Archivist Meetings: Not on file  . Marital Status: Not on file  Intimate Partner Violence:   . Fear of Current or Ex-Partner: Not on file  . Emotionally Abused: Not on file  . Physically Abused: Not on file  . Sexually Abused: Not on file   Review of Systems  Appetite is okay Weight stable     Objective:   Physical Exam  Constitutional: He appears well-developed. No distress.  HENT:  Normal canals/TMs and no cerumen  Psychiatric: He has a normal mood and affect. His behavior is normal.           Assessment & Plan:

## 2019-04-26 NOTE — Assessment & Plan Note (Signed)
Stable Trying to be more active Current medications are helping him maintain function

## 2019-04-26 NOTE — Assessment & Plan Note (Signed)
Left side May need to see audiologist--he doesn't seem ready

## 2019-04-26 NOTE — Assessment & Plan Note (Signed)
PDMP reviewed No concerns 

## 2019-05-03 ENCOUNTER — Other Ambulatory Visit: Payer: Self-pay | Admitting: Internal Medicine

## 2019-05-03 MED ORDER — OXYCODONE-ACETAMINOPHEN 5-325 MG PO TABS: 1.0000 | ORAL_TABLET | Freq: Three times a day (TID) | ORAL | 0 refills | Status: DC | PRN

## 2019-05-03 NOTE — Telephone Encounter (Signed)
Name of Medication: oxycodone apap 5-325 mg Name of Pharmacy:[facility] or Written Date and Quantity: #75 on 04-03-19 Last Office Visit and Type: 04-26-19 for 3 mth FU Next Office Visit and Type: 07-25-19 for 3 mth FU Last Controlled Substance Agreement Date: 10/28/18 Last UDS:10/18/18  Sending to [facility] in Dr Alla German absence

## 2019-05-03 NOTE — Telephone Encounter (Signed)
Patient is requesting a refill on his Percocet.  Patient has 1 pill left.  [facility] uses [facility].

## 2019-05-31 ENCOUNTER — Other Ambulatory Visit: Payer: Self-pay | Admitting: Internal Medicine

## 2019-05-31 NOTE — Telephone Encounter (Signed)
Name of Medication:oxycodone apap 5-325 mg Name of Pharmacy:[facility] Last Written Date and Quantity:#75 on 05-03-19 Last Office Visit and Type:04-26-19 for 3 mth FU Next Office Visit and Type:07-25-19 for 3 mth FU Last Controlled Substance Agreement Date:10/28/18 Last UDS:10/18/18

## 2019-05-31 NOTE — Telephone Encounter (Signed)
Patient called to get a refill on Percocet.  Patient has 1 pill left.  Patient uses [facility].

## 2019-06-01 MED ORDER — OXYCODONE-ACETAMINOPHEN 5-325 MG PO TABS: 1.0000 | ORAL_TABLET | Freq: Three times a day (TID) | ORAL | 0 refills | Status: DC | PRN

## 2019-07-03 ENCOUNTER — Other Ambulatory Visit: Payer: Self-pay | Admitting: Internal Medicine

## 2019-07-03 NOTE — Telephone Encounter (Signed)
Name of Medication:oxycodone apap 5-325 mg Name of Pharmacy:[facility] Last Written Date and Quantity:#75 on 05-31-19 Last Office Visit and Type:1-27-21for 3 mth FU Next Office Visit and Type:4-27-21for 3 mth FU Last Controlled Substance Agreement Date:10/28/18 Last UDS:10/18/18

## 2019-07-03 NOTE — Telephone Encounter (Signed)
Patient called to get a refill on Oxycodone 5/325mg .   Patient uses [facility].

## 2019-07-04 MED ORDER — OXYCODONE-ACETAMINOPHEN 5-325 MG PO TABS: 1.0000 | ORAL_TABLET | Freq: Three times a day (TID) | ORAL | 0 refills | Status: DC | PRN

## 2019-07-25 ENCOUNTER — Ambulatory Visit (INDEPENDENT_AMBULATORY_CARE_PROVIDER_SITE_OTHER): Payer: Medicare Other | Admitting: Internal Medicine

## 2019-07-25 ENCOUNTER — Encounter: Payer: Self-pay | Admitting: Internal Medicine

## 2019-07-25 ENCOUNTER — Other Ambulatory Visit: Payer: Self-pay

## 2019-07-25 VITALS — BP 140/66 | HR 45 | Temp 97.6°F | Ht 68.0 in | Wt 153.0 lb

## 2019-07-25 DIAGNOSIS — M5416 Radiculopathy, lumbar region: Secondary | ICD-10-CM

## 2019-07-25 DIAGNOSIS — K649 Unspecified hemorrhoids: Secondary | ICD-10-CM | POA: Diagnosis not present

## 2019-07-25 DIAGNOSIS — F1120 Opioid dependence, uncomplicated: Secondary | ICD-10-CM

## 2019-07-25 NOTE — Assessment & Plan Note (Signed)
Having more trouble and bleeding at times Will set up with GI to consider banding

## 2019-07-25 NOTE — Assessment & Plan Note (Signed)
PDMP reviewed No concerns 

## 2019-07-25 NOTE — Assessment & Plan Note (Addendum)
Stable pain and functional status Continues on the hydrocodone

## 2019-07-25 NOTE — Progress Notes (Signed)
Subjective:    Patient ID: Jeffrey Mcbride, male    DOB: 10-24-1955, 64 y.o.   MRN: [id number]  HPI Here for follow up of chronic pain and narcotic dependence This visit occurred during the SARS-CoV-2 public health emergency.  Safety protocols were in place, including screening questions prior to the visit, additional usage of staff PPE, and extensive cleaning of exam room while observing appropriate contact time as indicated for disinfecting solutions.   Doing okay Has had some trouble with hemorrhoids Has had them "split" and bleed at times Some relief with cortisone cream  Chronic back pain is about the same Hopes to work on fitness more May look into some kind of work Same narcotic use  Current Outpatient Medications on File Prior to Visit  Medication Sig Dispense Refill  . acetaminophen (TYLENOL) 500 MG tablet Take 250 mg by mouth every 6 (six) hours as needed.    Marland Kitchen ibuprofen (ADVIL,MOTRIN) 200 MG tablet Take 200 mg by mouth 3 (three) times daily.    Marland Kitchen oxyCODONE-acetaminophen (ROXICET) 5-325 MG tablet Take 1 tablet by mouth 3 (three) times daily as needed for severe pain. 75 tablet 0  . tiZANidine (ZANAFLEX) 4 MG tablet Take 1 tablet (4 mg total) by mouth at bedtime as needed for muscle spasms. 30 tablet 3   No current facility-administered medications on file prior to visit.    Allergies  Allergen Reactions  . Motrin [Ibuprofen] Other (See Comments)    High doses gives pt gas  . Naprosyn [Naproxen] Other (See Comments)    Elevated blood pressure    Past Medical History:  Diagnosis Date  . Allergic rhinitis due to pollen   . Back pain   . Hypertension   . Insomnia   . Lumbar back pain with radiculopathy affecting left lower extremity     Past Surgical History:  Procedure Laterality Date  . BACK SURGERY    . [place] SURGERY  2004  . LUMBAR FUSION     Dr Ronnald Ramp  . VASECTOMY      Family History  Problem Relation Age of Onset  . Heart disease Mother    . Hyperlipidemia Mother   . Heart disease Father   . Hyperlipidemia Father   . Alcohol abuse Sister   . Breast cancer Daughter   . Esophageal cancer Neg Hx   . Colon cancer Neg Hx   . Rectal cancer Neg Hx   . Stomach cancer Neg Hx     Social History   Socioeconomic History  . Marital status: Married    Spouse name: Not on file  . Number of children: 3  . Years of education: Not on file  . Highest education level: Not on file  Occupational History  . Occupation: HVAC--disabled  Tobacco Use  . Smoking status: Never Smoker  . Smokeless tobacco: Never Used  Substance and Sexual Activity  . Alcohol use: Yes    Alcohol/week: 0.0 standard drinks    Comment: "very little"  . Drug use: No  . Sexual activity: Not on file  Other Topics Concern  . Not on file  Social History Narrative   3 daughters   Disabled due to back problems   Caregiver for wife-- and helps with multiple grandchildren      No living will   Would want wife to make decisions---alternate daughter Rise Paganini   Would accept resuscitation attempts   Probably wouldn't want tube feeds if cognitively unaware   Wants to donate any  organs possible   Social Determinants of Health   Financial Resource Strain:   . Difficulty of Paying Living Expenses:   Food Insecurity:   . Worried About Charity fundraiser in the Last Year:   . Arboriculturist in the Last Year:   Transportation Needs:   . Film/video editor (Medical):   Marland Kitchen Lack of Transportation (Non-Medical):   Physical Activity:   . Days of Exercise per Week:   . Minutes of Exercise per Session:   Stress:   . Feeling of Stress :   Social Connections:   . Frequency of Communication with Friends and Family:   . Frequency of Social Gatherings with Friends and Family:   . Attends Religious Services:   . Active Member of Clubs or Organizations:   . Attends Archivist Meetings:   Marland Kitchen Marital Status:   Intimate Partner Violence:   . Fear of Current  or Ex-Partner:   . Emotionally Abused:   Marland Kitchen Physically Abused:   . Sexually Abused:    Review of Systems  Not sick Appetite is fine     Objective:   Physical Exam  Constitutional: He appears well-developed. No distress.  Genitourinary:    Genitourinary Comments: Small hemorrhoids            Assessment & Plan:

## 2019-07-28 ENCOUNTER — Other Ambulatory Visit: Payer: Self-pay | Admitting: Internal Medicine

## 2019-08-02 ENCOUNTER — Other Ambulatory Visit: Payer: Self-pay

## 2019-08-02 MED ORDER — OXYCODONE-ACETAMINOPHEN 5-325 MG PO TABS: 1.0000 | ORAL_TABLET | Freq: Three times a day (TID) | ORAL | 0 refills | Status: DC | PRN

## 2019-08-02 NOTE — Telephone Encounter (Signed)
Patient contacted the office requesting a refill on Oxycodone. Last refilled 07/04/19 for #75 with 0 refills. Patient was last seen 07/25/19 and has an upcoming appt on 11/08/19.  Ok to refill?

## 2019-08-10 ENCOUNTER — Encounter: Payer: Self-pay | Admitting: Internal Medicine

## 2019-08-15 DIAGNOSIS — H2513 Age-related nuclear cataract, bilateral: Secondary | ICD-10-CM | POA: Diagnosis not present

## 2019-08-15 DIAGNOSIS — H40033 Anatomical narrow angle, bilateral: Secondary | ICD-10-CM | POA: Diagnosis not present

## 2019-08-31 ENCOUNTER — Other Ambulatory Visit: Payer: Self-pay | Admitting: Internal Medicine

## 2019-08-31 MED ORDER — OXYCODONE-ACETAMINOPHEN 5-325 MG PO TABS: 1.0000 | ORAL_TABLET | Freq: Three times a day (TID) | ORAL | 0 refills | Status: DC | PRN

## 2019-08-31 NOTE — Telephone Encounter (Signed)
Patient called requesting refill    oxyCODONE-acetaminophen (ROXICET) 5-325 MG tablet   Patient stated he is completely out of medication     [facility] [address]

## 2019-08-31 NOTE — Telephone Encounter (Signed)
Name of Medication: oxycodone apap 5-325 mg Name of Pharmacy: [facility] or Written Date and Quantity: # 9 on 08/02/19 Last Office Visit and Type:07/25/19 for 3 mth FU  Next Office Visit and Type:11/08/2019 AWV  Last Controlled Substance Agreement Date: 10/28/18 Last UDS:10/18/18  Per Courtney's note pt is out of med.

## 2019-10-03 ENCOUNTER — Other Ambulatory Visit: Payer: Self-pay | Admitting: Internal Medicine

## 2019-10-03 MED ORDER — OXYCODONE-ACETAMINOPHEN 5-325 MG PO TABS: 1.0000 | ORAL_TABLET | Freq: Three times a day (TID) | ORAL | 0 refills | Status: DC | PRN

## 2019-10-03 NOTE — Telephone Encounter (Signed)
Name of Medication: oxycodone apap 5-325 mg Name of Pharmacy: [facility] or Written Date and Quantity: # 22 on 08/31/19 Last Office Visit and Type:07/25/19 for 3 mth FU  Next Office Visit and Type:11/08/2019 AWV  Last Controlled Substance Agreement Date: 10/28/18 Last UDS:10/18/18

## 2019-10-03 NOTE — Telephone Encounter (Signed)
Pt needs refill on oxycodone 5-3.25mg  pills. Pharmacy told him he need to call us to be refilled.

## 2019-10-17 ENCOUNTER — Encounter: Payer: Self-pay | Admitting: *Deleted

## 2019-10-26 ENCOUNTER — Ambulatory Visit: Payer: Medicare Other | Admitting: Internal Medicine

## 2019-10-27 ENCOUNTER — Ambulatory Visit: Payer: Medicare Other | Attending: Internal Medicine

## 2019-10-27 DIAGNOSIS — Z23 Encounter for immunization: Secondary | ICD-10-CM

## 2019-10-27 NOTE — Progress Notes (Signed)
   Covid-19 Vaccination Clinic  Name:  Lily Kernen    MRN: [id number] DOB: 1955/04/22  10/27/2019  Mr. Tritz was observed post Covid-19 immunization for 15 minutes without incident. He was provided with Vaccine Information Sheet and instruction to access the V-Safe system.   Mr. Bromell was instructed to call 911 with any severe reactions post vaccine: Marland Kitchen Difficulty breathing  . Swelling of face and throat  . A fast heartbeat  . A bad rash all over body  . Dizziness and weakness   Immunizations Administered    Name Date Dose VIS Date Route   Pfizer COVID-19 Vaccine 10/27/2019 10:25 AM 0.3 mL 05/24/2018 Intramuscular   Manufacturer: [place]   Lot: [id number]   [place]: [address]-9

## 2019-10-30 ENCOUNTER — Encounter: Payer: Self-pay | Admitting: Internal Medicine

## 2019-10-30 ENCOUNTER — Ambulatory Visit (INDEPENDENT_AMBULATORY_CARE_PROVIDER_SITE_OTHER): Payer: Medicare Other | Admitting: Internal Medicine

## 2019-10-30 DIAGNOSIS — Z86010 Personal history of colonic polyps: Secondary | ICD-10-CM | POA: Diagnosis not present

## 2019-10-30 DIAGNOSIS — K648 Other hemorrhoids: Secondary | ICD-10-CM | POA: Diagnosis not present

## 2019-10-30 MED ORDER — SUPREP BOWEL PREP KIT 17.5-3.13-1.6 GM/177ML PO SOLN
1.0000 | ORAL | 0 refills | Status: DC
Start: 2019-10-30 — End: 2019-12-28

## 2019-10-30 NOTE — Progress Notes (Signed)
Patient ID: Jeffrey Mcbride, male   DOB: 17-Jul-1955, 64 y.o.   MRN: [id number] HPI: Jeffrey Mcbride is a 64 year old male with a history of adenomatous colon polyps, hemorrhoids, hypertension, lumbar back disease and arthritis who is seen to discuss hemorrhoidal symptoms as well as colon polyp surveillance.  He is here alone today.  He is known to me from screening colonoscopy, his first, on 10/13/2016.  This colonoscopy revealed 6 polyps removed from the ascending colon, hepatic flexure, transverse colon and rectosigmoid colon.  There was sigmoid diverticulosis.  Hemorrhoids were found both internal and external which were medium in size.  Pathology revealed tubular adenomas and 1 hyperplastic polyp.  He reports that he has been feeling fairly well.  He is dealing with intermittent hemorrhoidal symptoms which for him is perianal swelling, prolapse and bleeding.  He is using a previous prescription hemorrhoid cream but very sparingly.  He denies abdominal pain.  Constipation is generally not a problem for him.  He does use oxycodone about 2-1/2 tablets/day, 5 mg, for chronic arthritis and back pain.  He denies upper GI and hepatobiliary complaint.  No melena.   Past Medical History:  Diagnosis Date  . Allergic rhinitis due to pollen   . Back pain   . Hypertension   . Insomnia   . Lumbar back pain with radiculopathy affecting left lower extremity   . Tubular adenoma of colon     Past Surgical History:  Procedure Laterality Date  . BACK SURGERY    . [place] SURGERY  2004  . LUMBAR FUSION     Dr Ronnald Ramp  . VASECTOMY      Outpatient Medications Prior to Visit  Medication Sig Dispense Refill  . acetaminophen (TYLENOL) 500 MG tablet Take 250 mg by mouth every 6 (six) hours as needed.    Marland Kitchen ibuprofen (ADVIL,MOTRIN) 200 MG tablet Take 200 mg by mouth 3 (three) times daily.    Marland Kitchen oxyCODONE-acetaminophen (ROXICET) 5-325 MG tablet Take 1 tablet by mouth 3 (three) times daily as needed for severe  pain. 75 tablet 0  . tiZANidine (ZANAFLEX) 4 MG tablet TAKE 1 TABLET (4 MG TOTAL) BY MOUTH AT BEDTIME AS NEEDED FOR MUSCLE SPASMS. 30 tablet 3   No facility-administered medications prior to visit.    Allergies  Allergen Reactions  . Motrin [Ibuprofen] Other (See Comments)    High doses gives pt gas  . Naprosyn [Naproxen] Other (See Comments)    Elevated blood pressure    Family History  Problem Relation Age of Onset  . Heart disease Mother   . Hyperlipidemia Mother   . Heart disease Father   . Hyperlipidemia Father   . Alcohol abuse Sister   . Breast cancer Daughter   . Esophageal cancer Neg Hx   . Colon cancer Neg Hx   . Rectal cancer Neg Hx   . Stomach cancer Neg Hx     Social History   Tobacco Use  . Smoking status: Never Smoker  . Smokeless tobacco: Never Used  Vaping Use  . Vaping Use: Never used  Substance Use Topics  . Alcohol use: Yes    Alcohol/week: 0.0 standard drinks    Comment: "very little"  . Drug use: No    ROS: As per history of present illness, otherwise negative  BP 106/70   Pulse 65   Ht 5\' 8"  (1.727 m)   Wt 146 lb 8 oz (66.5 kg)   BMI 22.28 kg/m  Gen: awake, alert, NAD HEENT:  anicterict Ext: no c/c/e Neuro: nonfocal   RELEVANT LABS AND IMAGING: CBC    Component Value Date/Time   WBC 9.7 10/18/2018 1252   RBC 4.78 10/18/2018 1252   HGB 14.8 10/18/2018 1252   HCT 44.5 10/18/2018 1252   PLT 300.0 10/18/2018 1252   MCV 93.1 10/18/2018 1252   MCH 29.8 08/10/2014 1340   MCHC 33.2 10/18/2018 1252   RDW 13.8 10/18/2018 1252   LYMPHSABS 2.1 08/11/2016 0944   MONOABS 0.6 08/11/2016 0944   EOSABS 0.8 (H) 08/11/2016 0944   BASOSABS 0.1 08/11/2016 0944    CMP     Component Value Date/Time   NA 140 10/18/2018 1252   K 4.1 10/18/2018 1252   CL 102 10/18/2018 1252   CO2 28 10/18/2018 1252   GLUCOSE 90 10/18/2018 1252   BUN 19 10/18/2018 1252   CREATININE 1.28 10/18/2018 1252   CALCIUM 9.9 10/18/2018 1252   PROT 7.7  10/18/2018 1252   ALBUMIN 5.2 10/18/2018 1252   AST 19 10/18/2018 1252   ALT 18 10/18/2018 1252   ALKPHOS 87 10/18/2018 1252   BILITOT 0.6 10/18/2018 1252   GFRNONAA >60 08/10/2014 1340   GFRAA >60 08/10/2014 1340    ASSESSMENT/PLAN: 64 year old male with a history of adenomatous colon polyps, hemorrhoids, hypertension, lumbar back disease and arthritis who is seen to discuss hemorrhoidal symptoms as well as colon polyp surveillance.  1.  Symptomatic internal hemorrhoids --he describes hemorrhoidal prolapse, swelling with irritation and intermittent bleeding.  Hemorrhoids were found at the time of his last colonoscopy.  We discussed treatment today and he would very likely benefit from hemorrhoidal banding.  He is due for surveillance colonoscopy which I recommended first followed by banding based on colonoscopy findings. --Anticipate banding at office visits after colonoscopy  2.  History of adenomatous colon polyps --surveillance colonoscopy recommended at this time.  We discussed the risk, benefits and alternatives and he is agreeable and wishes to proceed --Colonoscopy in the [facility] for surveillance of adenomatous polyps   GL:OVFIEP, Theophilus Kinds, Md [address] [place],  [place] [address]

## 2019-10-30 NOTE — Patient Instructions (Signed)
If you are age 66 or older, your body mass index should be between 23-30. Your Body mass index is 22.28 kg/m. If this is out of the aforementioned range listed, please consider follow up with your Primary Care Provider.  If you are age 35 or younger, your body mass index should be between 19-25. Your Body mass index is 22.28 kg/m. If this is out of the aformentioned range listed, please consider follow up with your Primary Care Provider.   You have been scheduled for a colonoscopy. Please follow written instructions given to you at your visit today.  Please pick up your prep supplies at the pharmacy within the next 1-3 days. If you use inhalers (even only as needed), please bring them with you on the day of your procedure.  Due to recent changes in healthcare laws, you may see the results of your imaging and laboratory studies on MyChart before your provider has had a chance to review them.  We understand that in some cases there may be results that are confusing or concerning to you. Not all laboratory results come back in the same time frame and the provider may be waiting for multiple results in order to interpret others.  Please give [place] 48 hours in order for your provider to thoroughly review all the results before contacting the office for clarification of your results.   Thank you for entrusting me with your care and choosing [facility].  Dr Hilarie Fredrickson

## 2019-11-02 ENCOUNTER — Other Ambulatory Visit: Payer: Self-pay | Admitting: Internal Medicine

## 2019-11-02 MED ORDER — OXYCODONE-ACETAMINOPHEN 5-325 MG PO TABS: 1.0000 | ORAL_TABLET | Freq: Three times a day (TID) | ORAL | 0 refills | Status: DC | PRN

## 2019-11-02 NOTE — Telephone Encounter (Signed)
Patient called stating he needs Oxycodine 5-325 MG refilled because he will be out by tomorrow. Please call patient when refill is sent.

## 2019-11-02 NOTE — Telephone Encounter (Signed)
Name of Medication:oxycodone apap 5-325 mg Name of Pharmacy:[facility] or Written Date and Quantity:# 65 on 10/03/19 Last Office Visit and Type:07/25/19 for 3 mth FU Next Office Visit and Type:11/08/2019 AWV Last Controlled Substance Agreement Date:10/28/18 Last UDS:10/18/18

## 2019-11-06 ENCOUNTER — Telehealth: Payer: Self-pay | Admitting: Internal Medicine

## 2019-11-06 NOTE — Telephone Encounter (Signed)
Spoke to pt. Advised him Danton Clap was correct. Medicare will not do a virtual. He is asking if he needs to r/s with his symptoms.

## 2019-11-06 NOTE — Telephone Encounter (Signed)
We can do a virtual 3 month follow up if he wants and defer the Medicare wellness for another 3 months----otherwise, he should reschedule

## 2019-11-06 NOTE — Telephone Encounter (Signed)
Left detailed message on VM per DPR. 

## 2019-11-06 NOTE — Telephone Encounter (Signed)
Patient called in stating he would like to know if he should do his physical 11/08/2019 virtual as he does have a cough and congestion still. Did notify patient unable to do cpe virtually and offered to reschedule. Pt stated that is not what his nurse told him and to send message. Please advise if able to change to virtual.

## 2019-11-07 NOTE — Telephone Encounter (Signed)
I called the pt and found out they cancelled his appt for tomorrow even for the 3 month f/u. They r/s for October. If he needs to be seen sooner, please advise.

## 2019-11-07 NOTE — Telephone Encounter (Signed)
I left another appt for the pt. It looks like his appt for tomorrow was cancelled. We needed to do his 3 month f/u. The appt has been taken since then.

## 2019-11-08 ENCOUNTER — Ambulatory Visit: Payer: Medicare Other | Admitting: Internal Medicine

## 2019-11-08 NOTE — Telephone Encounter (Addendum)
PDMP report printed and given to Dr Silvio Pate and left message for pt that we will take care of everything in October.

## 2019-11-08 NOTE — Telephone Encounter (Signed)
Okay to keep it for October. Just run the PDMP report for him now

## 2019-11-21 ENCOUNTER — Ambulatory Visit: Payer: Medicare Other | Attending: Internal Medicine

## 2019-11-21 DIAGNOSIS — Z23 Encounter for immunization: Secondary | ICD-10-CM

## 2019-11-21 NOTE — Progress Notes (Signed)
   Covid-19 Vaccination Clinic  Name:  Grantley Savage    MRN: [id number] DOB: December 15, 1955  11/21/2019  Mr. Luedke was observed post Covid-19 immunization for 15 minutes without incident. He was provided with Vaccine Information Sheet and instruction to access the V-Safe system.   Mr. Sebring was instructed to call 911 with any severe reactions post vaccine: Marland Kitchen Difficulty breathing  . Swelling of face and throat  . A fast heartbeat  . A bad rash all over body  . Dizziness and weakness   Immunizations Administered    Name Date Dose VIS Date Route   Pfizer COVID-19 Vaccine 11/21/2019 10:33 AM 0.3 mL 05/24/2018 Intramuscular   Manufacturer: [place]   Lot: [id number]   [place]: [id number]

## 2019-12-20 ENCOUNTER — Other Ambulatory Visit: Payer: Self-pay | Admitting: Internal Medicine

## 2019-12-20 ENCOUNTER — Telehealth: Payer: Self-pay | Admitting: *Deleted

## 2019-12-20 MED ORDER — OXYCODONE-ACETAMINOPHEN 5-325 MG PO TABS
1.0000 | ORAL_TABLET | Freq: Three times a day (TID) | ORAL | 0 refills | Status: DC | PRN
Start: 2019-12-20 — End: 2020-01-17

## 2019-12-20 NOTE — Telephone Encounter (Signed)
Pt is scheduled for a colonoscopy with Dr. Ardis Hughs on 12-21-19.  He saw Dr. Norman Herrlich in the office and for his last procedure; this appointment was made in error with Dr. Ardis Hughs.  Ok to proceed as scheduled with both Dr. Hilarie Fredrickson and Dr. Ardis Hughs, if ok with the patient. LMOM to make sure this change is ok with the patient or if he would like to reschedule with Dr. Hilarie Fredrickson another day.

## 2019-12-20 NOTE — Telephone Encounter (Signed)
Pt called needing to get a refill on Oxycodone  [facility]  Pt has 2 pills left

## 2019-12-20 NOTE — Telephone Encounter (Signed)
Name of Medication:oxycodone apap 5-325 mg Name of [facility]acy:[facility] or Written Date and Quantity:# 84 on8/5/21 Last Office Visit and Type:07/25/19 for 3 mth FU Next Office Visit and Type:01-17-20 Last Controlled Substance Agreement Date:10/18/18 Last UDS:10/18/18

## 2019-12-20 NOTE — Telephone Encounter (Signed)
Pt would like to stick with Dr. Norman Herrlich.  New appt made for 12-28-19 at 1530- new instructions given to pt and understanding voiced

## 2019-12-21 ENCOUNTER — Encounter: Payer: Medicare Other | Admitting: Gastroenterology

## 2019-12-28 ENCOUNTER — Ambulatory Visit (AMBULATORY_SURGERY_CENTER): Payer: Medicare Other | Admitting: Internal Medicine

## 2019-12-28 ENCOUNTER — Other Ambulatory Visit: Payer: Self-pay

## 2019-12-28 ENCOUNTER — Encounter: Payer: Self-pay | Admitting: Internal Medicine

## 2019-12-28 VITALS — BP 151/64 | HR 47 | Temp 98.4°F | Resp 14 | Ht 68.0 in | Wt 146.0 lb

## 2019-12-28 DIAGNOSIS — Z86010 Personal history of colonic polyps: Secondary | ICD-10-CM | POA: Diagnosis not present

## 2019-12-28 DIAGNOSIS — D124 Benign neoplasm of descending colon: Secondary | ICD-10-CM

## 2019-12-28 DIAGNOSIS — D125 Benign neoplasm of sigmoid colon: Secondary | ICD-10-CM | POA: Diagnosis not present

## 2019-12-28 DIAGNOSIS — K625 Hemorrhage of anus and rectum: Secondary | ICD-10-CM | POA: Diagnosis not present

## 2019-12-28 MED ORDER — SODIUM CHLORIDE 0.9 % IV SOLN: 500.0000 mL | Freq: Once | INTRAVENOUS | Status: DC

## 2019-12-28 NOTE — Progress Notes (Signed)
History reviewed today  VS CW  

## 2019-12-28 NOTE — Op Note (Signed)
[place] Patient Name: Jeffrey Mcbride Procedure Date: 12/28/2019 2:01 PM MRN: [id number] Endoscopist: Jerene Bears , MD Age: 64 Referring MD:  Date of Birth: 28-Jan-1956 Gender: Male Account #: [id number] Procedure:                Colonoscopy Indications:              High risk colon cancer surveillance: Personal                            history of multiple (5) adenomas, Last colonoscopy:                            July 2018 Medicines:                Monitored Anesthesia Care Procedure:                Pre-Anesthesia Assessment:                           - Prior to the procedure, a History and Physical                            was performed, and patient medications and                            allergies were reviewed. The patient's tolerance of                            previous anesthesia was also reviewed. The risks                            and benefits of the procedure and the sedation                            options and risks were discussed with the patient.                            All questions were answered, and informed consent                            was obtained. Prior Anticoagulants: The patient has                            taken no previous anticoagulant or antiplatelet                            agents. ASA Grade Assessment: II - A patient with                            mild systemic disease. After reviewing the risks                            and benefits, the patient was deemed in  satisfactory condition to undergo the procedure.                           After obtaining informed consent, the colonoscope                            was passed under direct vision. Throughout the                            procedure, the patient's blood pressure, pulse, and                            oxygen saturations were monitored continuously. The                            Colonoscope was introduced through the anus and                             advanced to the cecum, identified by appendiceal                            orifice and ileocecal valve. The colonoscopy was                            performed without difficulty. The patient tolerated                            the procedure well. The quality of the bowel                            preparation was good. The ileocecal valve,                            appendiceal orifice, and rectum were photographed. Scope In: 2:06:24 PM Scope Out: 2:20:27 PM Scope Withdrawal Time: 0 hours 10 minutes 18 seconds  Total Procedure Duration: 0 hours 14 minutes 3 seconds  Findings:                 Hemorrhoids were found on perianal exam.                           Two sessile polyps were found in the descending                            colon. The polyps were 3 to 4 mm in size. These                            polyps were removed with a cold snare. Resection                            and retrieval were complete.                           A 4 mm polyp was found in the sigmoid  colon. The                            polyp was sessile. The polyp was removed with a                            cold snare. Resection and retrieval were complete.                           Multiple small-mouthed diverticula were found in                            the sigmoid colon.                           External and internal hemorrhoids were found during                            retroflexion and during digital exam. The                            hemorrhoids were medium-sized and Grade III                            (internal hemorrhoids that prolapse but require                            manual reduction). 1 perianal skin tag. Complications:            No immediate complications. Estimated Blood Loss:     Estimated blood loss was minimal. Impression:               - Two 3 to 4 mm polyps in the descending colon,                            removed with a cold snare. Resected and retrieved.                            - One 4 mm polyp in the sigmoid colon, removed with                            a cold snare. Resected and retrieved.                           - Diverticulosis in the sigmoid colon.                           - External and internal hemorrhoids. Recommendation:           - Patient has a contact number available for                            emergencies. The signs and symptoms of potential  delayed complications were discussed with the                            patient. Return to normal activities tomorrow.                            Written discharge instructions were provided to the                            patient.                           - Resume previous diet.                           - Continue present medications.                           - Await pathology results.                           - Repeat colonoscopy is recommended for                            surveillance. The colonoscopy date will be                            determined after pathology results from today's                            exam become available for review.                           - Schedule hemorrhoidal banding appointments for                            symptomatic hemorrhoids. Jerene Bears, MD 12/28/2019 2:25:56 PM This report has been signed electronically.

## 2019-12-28 NOTE — Patient Instructions (Signed)
YOU HAD AN ENDOSCOPIC PROCEDURE TODAY AT THE [facility]:   Refer to the procedure report that was given to you for any specific questions about what was found during the examination.  If the procedure report does not answer your questions, please call your gastroenterologist to clarify.  If you requested that your care partner not be given the details of your procedure findings, then the procedure report has been included in a sealed envelope for you to review at your convenience later.  YOU SHOULD EXPECT: Some feelings of bloating in the abdomen. Passage of more gas than usual.  Walking can help get rid of the air that was put into your GI tract during the procedure and reduce the bloating. If you had a lower endoscopy (such as a colonoscopy or flexible sigmoidoscopy) you may notice spotting of blood in your stool or on the toilet paper. If you underwent a bowel prep for your procedure, you may not have a normal bowel movement for a few days.  Please Note:  You might notice some irritation and congestion in your nose or some drainage.  This is from the oxygen used during your procedure.  There is no need for concern and it should clear up in a day or so.  SYMPTOMS TO REPORT IMMEDIATELY:   Following lower endoscopy (colonoscopy or flexible sigmoidoscopy):  Excessive amounts of blood in the stool  Significant tenderness or worsening of abdominal pains  Swelling of the abdomen that is new, acute  Fever of 100F or higher    For urgent or emergent issues, a gastroenterologist can be reached at any hour by calling [phone]. Do not use MyChart messaging for urgent concerns.    DIET:  We do recommend a small meal at first, but then you may proceed to your regular diet.  Drink plenty of fluids but you should avoid alcoholic beverages for 24 hours.  ACTIVITY:  You should plan to take it easy for the rest of today and you should NOT DRIVE or use heavy machinery until tomorrow  (because of the sedation medicines used during the test).    FOLLOW UP: Our staff will call the number listed on your records 48-72 hours following your procedure to check on you and address any questions or concerns that you may have regarding the information given to you following your procedure. If we do not reach you, we will leave a message.  We will attempt to reach you two times.  During this call, we will ask if you have developed any symptoms of COVID 19. If you develop any symptoms (ie: fever, flu-like symptoms, shortness of breath, cough etc.) before then, please call [phone].  If you test positive for Covid 19 in the 2 weeks post procedure, please call and report this information to us.    If any biopsies were taken you will be contacted by phone or by letter within the next 1-3 weeks.  Please call us at [phone] if you have not heard about the biopsies in 3 weeks.    SIGNATURES/CONFIDENTIALITY: You and/or your care partner have signed paperwork which will be entered into your electronic medical record.  These signatures attest to the fact that that the information above on your After Visit Summary has been reviewed and is understood.  Full responsibility of the confidentiality of this discharge information lies with you and/or your care-partner.   Resume medications. Information given on polyps,diverticulosis and hemorrhoids. 

## 2019-12-28 NOTE — Progress Notes (Signed)
Called to room to assist during endoscopic procedure.  Patient ID and intended procedure confirmed with present staff. Received instructions for my participation in the procedure from the performing physician.  

## 2019-12-28 NOTE — Progress Notes (Signed)
Report to PACU, RN, vss, BBS= Clear.  

## 2019-12-29 ENCOUNTER — Telehealth: Payer: Self-pay | Admitting: *Deleted

## 2019-12-29 NOTE — Telephone Encounter (Signed)
-----   Message from Jerene Bears, MD sent at 12/28/2019  2:57 PM EDT ----- Pt needs banding appts x 3 Thanks JMP

## 2019-12-29 NOTE — Telephone Encounter (Signed)
We currently only have a schedule available to schedule 1 banding appointment. Patient has been scheduled for Dr Vena Rua first availability on 02/29/20 at 3:40 pm. He verbalizes and agrees to this plan.

## 2020-01-01 ENCOUNTER — Telehealth: Payer: Self-pay | Admitting: *Deleted

## 2020-01-01 ENCOUNTER — Telehealth: Payer: Self-pay

## 2020-01-01 NOTE — Telephone Encounter (Signed)
  Follow up Call-  Call back number 12/28/2019  Post procedure Call Back phone  # [phone]  Permission to leave phone message Yes  Some recent data might be hidden     Patient questions:  Do you have a fever, pain , or abdominal swelling? No. Pain Score  0 *  Have you tolerated food without any problems? Yes.    Have you been able to return to your normal activities? Yes.    Do you have any questions about your discharge instructions: Diet   No. Medications  No. Follow up visit  No.  Do you have questions or concerns about your Care? No.  Actions: * If pain score is 4 or above: No action needed, pain <4.  1. Have you developed a fever since your procedure? no  2.   Have you had an respiratory symptoms (SOB or cough) since your procedure? no  3.   Have you tested positive for COVID 19 since your procedure no  4.   Have you had any family members/close contacts diagnosed with the COVID 19 since your procedure?  no   If yes to any of these questions please route to Joylene John, RN and Joella Prince, RN

## 2020-01-01 NOTE — Telephone Encounter (Signed)
Left message on follow up call. 

## 2020-01-04 ENCOUNTER — Encounter: Payer: Self-pay | Admitting: Internal Medicine

## 2020-01-17 ENCOUNTER — Encounter: Payer: Self-pay | Admitting: Internal Medicine

## 2020-01-17 ENCOUNTER — Other Ambulatory Visit: Payer: Self-pay | Admitting: Internal Medicine

## 2020-01-17 ENCOUNTER — Ambulatory Visit (INDEPENDENT_AMBULATORY_CARE_PROVIDER_SITE_OTHER): Payer: Medicare Other | Admitting: Internal Medicine

## 2020-01-17 VITALS — BP 130/70 | HR 48 | Temp 97.1°F | Ht 68.25 in | Wt 151.0 lb

## 2020-01-17 DIAGNOSIS — I10 Essential (primary) hypertension: Secondary | ICD-10-CM

## 2020-01-17 DIAGNOSIS — E7800 Pure hypercholesterolemia, unspecified: Secondary | ICD-10-CM | POA: Diagnosis not present

## 2020-01-17 DIAGNOSIS — M5416 Radiculopathy, lumbar region: Secondary | ICD-10-CM

## 2020-01-17 DIAGNOSIS — Z0000 Encounter for general adult medical examination without abnormal findings: Secondary | ICD-10-CM | POA: Diagnosis not present

## 2020-01-17 DIAGNOSIS — J301 Allergic rhinitis due to pollen: Secondary | ICD-10-CM

## 2020-01-17 DIAGNOSIS — F1120 Opioid dependence, uncomplicated: Secondary | ICD-10-CM

## 2020-01-17 LAB — LIPID PANEL
Cholesterol: 191 mg/dL (ref 0–200)
HDL: 45 mg/dL (ref >39.00)
LDL Cholesterol: 127 mg/dL — ABNORMAL HIGH (ref 0–99)
NonHDL: 146.2
Total CHOL/HDL Ratio: 4
Triglycerides: 96 mg/dL (ref 0.0–149.0)
VLDL: 19.2 mg/dL (ref 0.0–40.0)

## 2020-01-17 LAB — COMPREHENSIVE METABOLIC PANEL
ALT: 14 U/L (ref 0–53)
AST: 18 U/L (ref 0–37)
Albumin: 4.6 g/dL (ref 3.5–5.2)
Alkaline Phosphatase: 68 U/L (ref 39–117)
BUN: 13 mg/dL (ref 6–23)
CO2: 31 mEq/L (ref 19–32)
Calcium: 9.2 mg/dL (ref 8.4–10.5)
Chloride: 102 mEq/L (ref 96–112)
Creatinine, Ser: 1.32 mg/dL (ref 0.40–1.50)
GFR: 55.78 mL/min — ABNORMAL LOW (ref >60.00)
Glucose, Bld: 93 mg/dL (ref 70–99)
Potassium: 3.8 mEq/L (ref 3.5–5.1)
Sodium: 139 mEq/L (ref 135–145)
Total Bilirubin: 0.7 mg/dL (ref 0.2–1.2)
Total Protein: 7.2 g/dL (ref 6.0–8.3)

## 2020-01-17 LAB — CBC
HCT: 39.5 % (ref 39.0–52.0)
Hemoglobin: 13.4 g/dL (ref 13.0–17.0)
MCHC: 33.9 g/dL (ref 30.0–36.0)
MCV: 92.5 fl (ref 78.0–100.0)
Platelets: 276 10*3/uL (ref 150.0–400.0)
RBC: 4.27 Mil/uL (ref 4.22–5.81)
RDW: 14 % (ref 11.5–15.5)
WBC: 7.8 10*3/uL (ref 4.0–10.5)

## 2020-01-17 MED ORDER — OXYCODONE-ACETAMINOPHEN 5-325 MG PO TABS: 1.0000 | ORAL_TABLET | Freq: Three times a day (TID) | ORAL | 0 refills | Status: DC | PRN

## 2020-01-17 NOTE — Progress Notes (Signed)
Hearing Screening   Method: Audiometry   125Hz  250Hz  500Hz  1000Hz  2000Hz  3000Hz  4000Hz  6000Hz  8000Hz   Right ear:   20 20 40  0    Left ear:   20 20 20   0    Vision Screening Comments: May 2021

## 2020-01-17 NOTE — Assessment & Plan Note (Signed)
BP Readings from Last 3 Encounters:  01/17/20 130/70  12/28/19 (!) 151/64  10/30/19 106/70   Good control without meds now

## 2020-01-17 NOTE — Assessment & Plan Note (Signed)
Mild elevation only No statin for now

## 2020-01-17 NOTE — Assessment & Plan Note (Signed)
I have personally reviewed the Medicare Annual Wellness questionnaire and have noted 1. The patient's medical and social history 2. Their use of alcohol, tobacco or illicit drugs 3. Their current medications and supplements 4. The patient's functional ability including ADL's, fall risks, home safety risks and hearing or visual             impairment. 5. Diet and physical activities 6. Evidence for depression or mood disorders  The patients weight, height, BMI and visual acuity have been recorded in the chart I have made referrals, counseling and provided education to the patient based review of the above and I have provided the pt with a written personalized care plan for preventive services.  I have provided you with a copy of your personalized plan for preventive services. Please take the time to review along with your updated medication list.  Discussed exercise--safe for back Colonoscopy due again in 5 years--had a polyp Will defer PSA to next year Prefers no flu or pneumovax

## 2020-01-17 NOTE — Assessment & Plan Note (Signed)
PDMP reviewed No concerns 

## 2020-01-17 NOTE — Assessment & Plan Note (Signed)
Uses an OTC antihistamine He will email the name of this

## 2020-01-17 NOTE — Telephone Encounter (Signed)
Pt called he forgot to get a refill on his rx Oxycodone 5-325  [facility] [place]  Pt has enough till Friday
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
## 2020-01-17 NOTE — Assessment & Plan Note (Signed)
Chronic pain Uses the oxycodone regularly

## 2020-01-18 ENCOUNTER — Telehealth: Payer: Self-pay | Admitting: Internal Medicine

## 2020-01-18 NOTE — Telephone Encounter (Signed)
Let him know that it can cause confusion and daytime sedation (so not recommended as people get older) If he takes it only occasionally, and doesn't note any problems, it is probably okay

## 2020-01-18 NOTE — Telephone Encounter (Signed)
Left detailed message on verified VM. 

## 2020-01-18 NOTE — Telephone Encounter (Signed)
He has been taking ALLERGY  DIPHENHYDRMINE HCL  25MG   He takes 1/2 -1 pill and wanted to know if it will mess with anything

## 2020-01-20 LAB — DRUG MONITORING, PANEL 8 WITH CONFIRMATION, URINE
6 Acetylmorphine: NEGATIVE ng/mL (ref <10)
Alcohol Metabolites: NEGATIVE ng/mL
Amphetamines: NEGATIVE ng/mL (ref <500)
Benzodiazepines: NEGATIVE ng/mL (ref <100)
Buprenorphine, Urine: NEGATIVE ng/mL (ref <5)
Cocaine Metabolite: NEGATIVE ng/mL (ref <150)
Codeine: NEGATIVE ng/mL (ref <50)
Creatinine: 90.4 mg/dL
Hydrocodone: NEGATIVE ng/mL (ref <50)
Hydromorphone: NEGATIVE ng/mL (ref <50)
MDMA: NEGATIVE ng/mL (ref <500)
Marijuana Metabolite: 1285 ng/mL — ABNORMAL HIGH (ref <5)
Marijuana Metabolite: POSITIVE ng/mL — AB (ref <20)
Morphine: NEGATIVE ng/mL (ref <50)
Norhydrocodone: NEGATIVE ng/mL (ref <50)
Noroxycodone: 709 ng/mL — ABNORMAL HIGH (ref <50)
Opiates: NEGATIVE ng/mL (ref <100)
Oxidant: NEGATIVE ug/mL
Oxycodone: 187 ng/mL — ABNORMAL HIGH (ref <50)
Oxycodone: POSITIVE ng/mL — AB (ref <100)
Oxymorphone: 434 ng/mL — ABNORMAL HIGH (ref <50)
pH: 5.5 (ref 4.5–9.0)

## 2020-01-20 LAB — DM TEMPLATE

## 2020-02-20 ENCOUNTER — Encounter: Payer: Self-pay | Admitting: *Deleted

## 2020-02-28 ENCOUNTER — Telehealth: Payer: Self-pay | Admitting: Internal Medicine

## 2020-02-28 MED ORDER — OXYCODONE-ACETAMINOPHEN 5-325 MG PO TABS: 1.0000 | ORAL_TABLET | Freq: Three times a day (TID) | ORAL | 0 refills | Status: DC | PRN

## 2020-02-28 NOTE — Telephone Encounter (Signed)
Medication Refill - Medication:  oxyCODONE-acetaminophen (ROXICET) 5-325 MG tablet    Has the patient contacted their pharmacy? No advised to call due to controlled substance.   Preferred Pharmacy (with phone number or street name): [id number] [place], [place] Phone:  [phone]  Fax:  [phone]

## 2020-02-29 ENCOUNTER — Encounter: Payer: Medicare Other | Admitting: Internal Medicine

## 2020-04-11 MED ORDER — OXYCODONE-ACETAMINOPHEN 5-325 MG PO TABS
1.0000 | ORAL_TABLET | Freq: Three times a day (TID) | ORAL | 0 refills | Status: DC | PRN
Start: 2020-04-11 — End: 2020-06-20

## 2020-04-11 NOTE — Addendum Note (Signed)
Addended by: Viviana Simpler I on: 04/11/2020 12:28 PM   Modules accepted: Orders

## 2020-04-11 NOTE — Telephone Encounter (Signed)
Pt called in he is out of his medication and he is set to have his apt on 04/19/20

## 2020-04-11 NOTE — Telephone Encounter (Signed)
Rx done. 

## 2020-04-15 ENCOUNTER — Encounter: Payer: Medicare Other | Admitting: Internal Medicine

## 2020-04-19 ENCOUNTER — Telehealth: Payer: Medicare Other | Admitting: Internal Medicine

## 2020-04-29 ENCOUNTER — Other Ambulatory Visit: Payer: Self-pay

## 2020-04-29 ENCOUNTER — Telehealth (INDEPENDENT_AMBULATORY_CARE_PROVIDER_SITE_OTHER): Payer: Medicare Other | Admitting: Internal Medicine

## 2020-04-29 ENCOUNTER — Encounter: Payer: Self-pay | Admitting: Internal Medicine

## 2020-04-29 DIAGNOSIS — F1120 Opioid dependence, uncomplicated: Secondary | ICD-10-CM

## 2020-04-29 DIAGNOSIS — M5416 Radiculopathy, lumbar region: Secondary | ICD-10-CM | POA: Diagnosis not present

## 2020-04-29 NOTE — Progress Notes (Signed)
Subjective:    Patient ID: Jeffrey Mcbride, male    DOB: 1955/05/24, 65 y.o.   MRN: [id number]  HPI Telephone virtual visit for follow up of chronic pain and narcotic dependence Identification done Reviewed limitations and billing and he gave consent Participants---patient in his home and I am in my office  Doing well Took a job at [employer].  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 diphenhydrAMINE (BENADRYL) 25 MG tablet Take 25 mg by mouth as needed.    Marland Kitchen ibuprofen (ADVIL,MOTRIN) 200 MG tablet Take 200 mg by mouth 3 (three) times daily.    Marland Kitchen oxyCODONE-acetaminophen (ROXICET) 5-325 MG tablet Take 1 tablet by mouth 3 (three) times daily as needed for severe pain. 75 tablet 0  . tiZANidine (ZANAFLEX) 4 MG tablet TAKE 1 TABLET (4 MG TOTAL) BY MOUTH AT BEDTIME AS NEEDED FOR MUSCLE SPASMS. 30 tablet 3   No current facility-administered medications on file prior to visit.    Allergies  Allergen Reactions  . Motrin [Ibuprofen] Other (See Comments)    High doses gives pt gas  . Naprosyn [Naproxen] Other (See Comments)    Elevated blood pressure    Past Medical History:  Diagnosis Date  . Allergic rhinitis due to pollen   . Back pain   . Diverticulosis   . External hemorrhoids   . Hypertension   . Insomnia   . Internal hemorrhoids   . Lumbar back pain with radiculopathy affecting left lower extremity   . Tubular adenoma of colon     Past Surgical History:  Procedure  Laterality Date  . BACK SURGERY    . [place] SURGERY  2004  . LUMBAR FUSION     Dr Ronnald Ramp  . VASECTOMY      Family History  Problem Relation Age of Onset  . Heart disease Mother   . Hyperlipidemia Mother   . Heart disease Father   . Hyperlipidemia Father   . Alcohol abuse Sister   . Breast cancer Daughter   . Esophageal cancer Neg Hx   . Colon cancer Neg Hx   . Rectal cancer Neg Hx   . Stomach cancer Neg Hx     Social History   Socioeconomic History  . Marital status: Married    Spouse name: Not on file  . Number of children: 3  . Years of education: Not on file  . Highest education level: Not on file  Occupational History  . Occupation: HVAC--disabled  Tobacco Use  . Smoking status: Never Smoker  . Smokeless tobacco: Never Used  Vaping Use  . Vaping Use: Never used  Substance and Sexual Activity  . Alcohol use: Yes    Alcohol/week: 0.0 standard drinks    Comment: "very little"  . Drug use: No  . Sexual activity: Not on file  Other Topics Concern  . Not on file  Social History Narrative   3 daughters   Disabled due to back problems  Caregiver for wife-- and helps with multiple grandchildren      No living will   Would want wife to make decisions---alternate daughter Rise Paganini   Would accept resuscitation attempts   Not sure about tube feeds--would leave it up to family   Wants to donate any organs possible   Social Determinants of Health   Financial Resource Strain: Not on file  Food Insecurity: Not on file  Transportation Needs: Not on file  Physical Activity: Not on file  Stress: Not on file  Social Connections: Not on file  Intimate Partner Violence: Not on file   Review of Systems Did lose some weight due to the exertion---but eating more and gaining this back Is due to have hemorrhoid Rx soon Sleeping okay Mood is better---he has a sense of accomplishment working again    Objective:   Physical Exam Neurological:     Mental Status: He  is alert.  Psychiatric:     Comments: Sounds upbeat            Assessment & Plan:

## 2020-04-29 NOTE — Assessment & Plan Note (Signed)
PDMP reviewed No concerns  Total time for telephone visit was 11 minutes

## 2020-04-29 NOTE — Assessment & Plan Note (Signed)
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

## 2020-04-30 ENCOUNTER — Telehealth: Payer: Self-pay | Admitting: Internal Medicine

## 2020-04-30 NOTE — Telephone Encounter (Signed)
Dr.Letvak is requesting patient schedule a follow up in 3 months--can be virtual but in person preferred. I left a detailed message on patient's voice mail for him to call back and schedule follow up appointment.

## 2020-05-14 ENCOUNTER — Encounter: Payer: Medicare Other | Admitting: Internal Medicine

## 2020-06-20 ENCOUNTER — Encounter: Payer: Medicare Other | Admitting: Internal Medicine

## 2020-06-20 ENCOUNTER — Other Ambulatory Visit: Payer: Self-pay | Admitting: Internal Medicine

## 2020-06-20 MED ORDER — OXYCODONE-ACETAMINOPHEN 5-325 MG PO TABS: 1.0000 | ORAL_TABLET | Freq: Three times a day (TID) | ORAL | 0 refills | Status: DC | PRN

## 2020-06-20 NOTE — Telephone Encounter (Signed)
  LAST APPOINTMENT DATE: 04/30/2020   NEXT APPOINTMENT DATE:@5 /04/2020  MEDICATION: oxycodone  [facility]  Let patient know to contact pharmacy at the end of the day to make sure medication is ready.  Please notify patient to allow 48-72 hours to process  Encourage patient to contact the pharmacy for refills or they can request refills through [place]:   LAST REFILL:  QTY:  REFILL DATE:    OTHER COMMENTS:    Okay for refill?  Please advise

## 2020-06-20 NOTE — Telephone Encounter (Signed)
Name of Medication: Oxycodone Name of [facility] or Written Date and Quantity: 04-11-20 #75 Last Office Visit and Type: 04-29-20 Next Office Visit and Type: 07-29-20 Last Controlled Substance Agreement Date: 01-17-20 Last UDS: 01-17-20

## 2020-07-29 ENCOUNTER — Telehealth (INDEPENDENT_AMBULATORY_CARE_PROVIDER_SITE_OTHER): Payer: Medicare Other | Admitting: Internal Medicine

## 2020-07-29 ENCOUNTER — Encounter: Payer: Self-pay | Admitting: Internal Medicine

## 2020-07-29 ENCOUNTER — Other Ambulatory Visit: Payer: Self-pay

## 2020-07-29 DIAGNOSIS — M5416 Radiculopathy, lumbar region: Secondary | ICD-10-CM | POA: Diagnosis not present

## 2020-07-29 DIAGNOSIS — F1120 Opioid dependence, uncomplicated: Secondary | ICD-10-CM

## 2020-07-29 NOTE — Assessment & Plan Note (Signed)
PDMP reviewed  No concerns  Total visit 13 minutes

## 2020-07-29 NOTE — Assessment & Plan Note (Signed)
Has been doing better of late. Not using the oxycodone as much Discussed trying the ibuprofen at work---after eating due to it bothering his stomach Is able to work part time maintenance

## 2020-07-29 NOTE — Progress Notes (Signed)
Subjective:    Patient ID: Jeffrey Mcbride, male    DOB: 1956/03/20, 65 y.o.   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 diphenhydrAMINE (BENADRYL) 25 MG tablet Take 25 mg by mouth as needed.    Marland Kitchen ibuprofen (ADVIL,MOTRIN) 200 MG tablet Take 200 mg by mouth 3 (three) times daily.    Marland Kitchen oxyCODONE-acetaminophen (ROXICET) 5-325 MG tablet Take 1 tablet by mouth 3 (three) times daily as needed for severe pain. 75 tablet 0  . tiZANidine (ZANAFLEX) 4 MG tablet TAKE 1 TABLET (4 MG TOTAL) BY MOUTH AT BEDTIME AS NEEDED FOR MUSCLE SPASMS. (Patient not taking: Reported on 07/29/2020) 30 tablet 3   No current facility-administered medications on file prior to visit.    Allergies  Allergen Reactions  . Motrin [Ibuprofen] Other (See Comments)    High doses gives pt gas  . Naprosyn [Naproxen] Other (See Comments)    Elevated blood pressure    Past Medical History:  Diagnosis Date  . Allergic rhinitis due to pollen   . Back pain   . Diverticulosis   . External hemorrhoids   . Hypertension   . Insomnia   . Internal hemorrhoids   . Lumbar back pain with radiculopathy affecting left lower extremity   . Tubular adenoma of colon      Past Surgical History:  Procedure Laterality Date  . BACK SURGERY    . [place] SURGERY  2004  . LUMBAR FUSION     Dr Ronnald Ramp  . VASECTOMY      Family History  Problem Relation Age of Onset  . Heart disease Mother   . Hyperlipidemia Mother   . Heart disease Father   . Hyperlipidemia Father   . Alcohol abuse Sister   . Breast cancer Daughter   . Esophageal cancer Neg Hx   . Colon cancer Neg Hx   . Rectal cancer Neg Hx   . Stomach cancer Neg Hx     Social History   Socioeconomic History  . Marital status: Married    Spouse name: Not on file  . Number of children: 3  . Years of education: Not on file  . Highest education level: Not on file  Occupational History  . Occupation: HVAC--disabled  . Occupation: Part time maintenance    Comment: [employer]  Tobacco Use  . Smoking status: Never Smoker  . Smokeless tobacco: Never Used  Vaping Use  . Vaping Use: Never used  Substance and Sexual Activity  . Alcohol use: Yes    Alcohol/week: 0.0 standard drinks    Comment: "very little"  . Drug use: No  . Sexual activity: Not on file  Other Topics Concern  . Not on file  Social  History Narrative   3 daughters   Disabled due to back problems   Caregiver for wife-- and helps with multiple grandchildren      No living will   Would want wife to make decisions---alternate daughter Rise Paganini   Would accept resuscitation attempts   Not sure about tube feeds--would leave it up to family   Wants to donate any organs possible   Social Determinants of Health   Financial Resource Strain: Not on file  Food Insecurity: Not on file  Transportation Needs: Not on file  Physical Activity: Not on file  Stress: Not on file  Social Connections: Not on file  Intimate Partner Violence: Not on file   Review of Systems Sleeps okay Appetite is fine    Objective:   Physical Exam Neurological:     Mental Status: He is alert.  Psychiatric:        Mood and Affect:  Mood normal.        Behavior: Behavior normal.            Assessment & Plan:

## 2020-08-28 ENCOUNTER — Other Ambulatory Visit: Payer: Self-pay | Admitting: Internal Medicine

## 2020-08-28 NOTE — Telephone Encounter (Signed)
  LAST APPOINTMENT DATE: 06/20/2020   NEXT APPOINTMENT DATE:@Visit  date not found  MEDICATION: oxycodone roxiicet  [facility] Patient only has 2 left.   Let patient know to contact pharmacy at the end of the day to make sure medication is ready.  Please notify patient to allow 48-72 hours to process  Encourage patient to contact the pharmacy for refills or they can request refills through [place]:   LAST REFILL:  QTY:  REFILL DATE:    OTHER COMMENTS:    Okay for refill?  Please advise

## 2020-08-28 NOTE — Telephone Encounter (Signed)
Name of Medication: Oxycodone Name of [facility] or Written Date and Quantity: 06-20-20 #75 Last Office Visit and Type: 07-29-20 Next Office Visit and Type: Not scheduled Last Controlled Substance Agreement Date: 01-17-20 Last UDS: 01-17-20

## 2020-08-29 MED ORDER — OXYCODONE-ACETAMINOPHEN 5-325 MG PO TABS: 1.0000 | ORAL_TABLET | Freq: Three times a day (TID) | ORAL | 0 refills | Status: DC | PRN

## 2020-09-19 ENCOUNTER — Telehealth: Payer: Self-pay | Admitting: Internal Medicine

## 2020-09-19 NOTE — Telephone Encounter (Signed)
Left voicemail regarding scheduling a AMW. Pt did not answer

## 2020-10-20 DIAGNOSIS — Z20822 Contact with and (suspected) exposure to covid-19: Secondary | ICD-10-CM | POA: Diagnosis not present

## 2020-10-21 ENCOUNTER — Other Ambulatory Visit: Payer: Self-pay | Admitting: Internal Medicine

## 2020-10-21 ENCOUNTER — Telehealth: Payer: Self-pay | Admitting: Internal Medicine

## 2020-10-21 MED ORDER — OXYCODONE-ACETAMINOPHEN 5-325 MG PO TABS: 1.0000 | ORAL_TABLET | Freq: Three times a day (TID) | ORAL | 0 refills | Status: DC | PRN

## 2020-10-21 NOTE — Addendum Note (Signed)
Addended by: Pilar Grammes on: 10/21/2020 02:37 PM   Modules accepted: Orders

## 2020-10-21 NOTE — Telephone Encounter (Signed)
Spoke to pt to see if he needed anything. He said he just wanted to give an FYI. I advised him that since he is seeing improvement, if his Covid test did come up positive, that he most likely did not need the anti-viral. He will let us know if he takes a turn for the worse.

## 2020-10-21 NOTE — Telephone Encounter (Signed)
  LAST APPOINTMENT DATE:  Please schedule appointment if longer than 1 year  NEXT APPOINTMENT DATE:'@10'$ /26/2022  MEDICATION: oxyCODONE-acetaminophen (ROXICET) 5-325 MG tablet  Is the patient out of medication? yes  [facility]  Let patient know to contact pharmacy at the end of the day to make sure medication is ready.  Please notify patient to allow 48-72 hours to process  Encourage patient to contact the pharmacy for refills or they can request refills through [place]:   LAST REFILL:  QTY:  REFILL DATE:    OTHER COMMENTS:    Okay for refill?  Please advise

## 2020-10-21 NOTE — Telephone Encounter (Signed)
Name of Medication: Oxycodone Name of [facility]acy[facility] or Written Date and Quantity: 08-29-20 #75 Last Office Visit and Type: 07-29-20 Next Office Visit and Type: 01-22-21 Last Controlled Substance Agreement Date: 01-17-20 Last UDS: 01-17-20

## 2020-10-21 NOTE — Telephone Encounter (Signed)
He has been running a fever of 101 for the past 4 days, and earache, sore throat, and headache. Hes feeling some what better now.  [facility] and he is waiting for his results.

## 2020-10-22 ENCOUNTER — Telehealth: Payer: Self-pay

## 2020-10-22 NOTE — Telephone Encounter (Signed)
New message     Patient tested positive for COVID - symptoms are getting better, will be self isolated until next week.

## 2020-12-16 ENCOUNTER — Encounter: Payer: Self-pay | Admitting: Internal Medicine

## 2020-12-16 ENCOUNTER — Ambulatory Visit (INDEPENDENT_AMBULATORY_CARE_PROVIDER_SITE_OTHER): Payer: Medicare Other | Admitting: Internal Medicine

## 2020-12-16 ENCOUNTER — Other Ambulatory Visit: Payer: Self-pay

## 2020-12-16 VITALS — BP 118/80 | HR 55 | Temp 97.7°F | Ht 68.5 in | Wt 143.0 lb

## 2020-12-16 DIAGNOSIS — R0789 Other chest pain: Secondary | ICD-10-CM | POA: Insufficient documentation

## 2020-12-16 NOTE — Assessment & Plan Note (Addendum)
With feeling of change in heart and ?SOB (he wondered if it could be his lungs) Occurs with exercise Discussed that this sounds like (for now) stable angina Will check EKG  EKG shows sinus brady at 52. Normal axis and intervals. No hypertrophy or ischemia. Since 5/16---rate is slower (otherwise no change)  His pattern of symptoms is suspicious for angina. Discussed 911 if persistent symptoms Will set up with cardiology ASAP

## 2020-12-16 NOTE — Addendum Note (Signed)
Addended by: Pilar Grammes on: 12/16/2020 03:38 PM   Modules accepted: Orders

## 2020-12-16 NOTE — Progress Notes (Signed)
Subjective:    Patient ID: Jeffrey Mcbride, male    DOB: 1955/10/04, 65 y.o.   MRN: [id number]  HPI Here due to dizziness --and follow up of chronic pain This visit occurred during the SARS-CoV-2 public health emergency.  Safety protocols were in place, including screening questions prior to the visit, additional usage of staff PPE, and extensive cleaning of exam room while observing appropriate contact time as indicated for disinfecting solutions.   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 for the past month or so Lots of stress though-- "a lot to handle" Not resting well---waking at [employer] every morning (never did this before) Feeling dizzy and tired--so now out of work for a few days  Had COVID 2 months ago---lost 15# (now gained some back) Also with other "feelings" since COVID BP elevated at times 140-150/60's Has lightheadedness---"trouble focusing" Some imbalance when walking No feeling like he will pass out  He will also feel a "fluttering" in his chest and tightness when he gets excited (this preceded COVID). Passes within a few seconds Tends to get this with exertion---when pushing it/sex, etc Rarely using the oxycodone now--it makes him cloudy so doesn't want it when he is working [employer] a Physiological scientist" of benedryl at night  Current Outpatient Medications on File Prior to Visit  Medication Sig Dispense Refill   diphenhydrAMINE (BENADRYL) 25 MG tablet Take 25 mg by mouth as needed.     ibuprofen (ADVIL,MOTRIN) 200 MG tablet Take 200 mg by mouth 3 (three) times daily.     oxyCODONE-acetaminophen (ROXICET) 5-325 MG tablet Take 1 tablet by mouth 3 (three) times daily as needed for severe pain. 75 tablet 0   tiZANidine (ZANAFLEX) 4 MG tablet TAKE 1 TABLET (4 MG TOTAL) BY MOUTH AT BEDTIME AS NEEDED FOR MUSCLE SPASMS.  (Patient not taking: Reported on 12/16/2020) 30 tablet 3   No current facility-administered medications on file prior to visit.    Allergies  Allergen Reactions   Motrin [Ibuprofen] Other (See Comments)    High doses gives pt gas   Naprosyn [Naproxen] Other (See Comments)    Elevated blood pressure    Past Medical History:  Diagnosis Date   Allergic rhinitis due to pollen    Back pain    Diverticulosis    External hemorrhoids    Hypertension    Insomnia    Internal hemorrhoids    Lumbar back pain with radiculopathy affecting left lower extremity    Tubular adenoma of colon     Past Surgical History:  Procedure Laterality Date   BACK SURGERY     LUMBAR DISC SURGERY  2004   LUMBAR FUSION     Dr Ronnald Ramp   VASECTOMY      Family History  Problem Relation Age of Onset   Heart disease Mother    Hyperlipidemia Mother    Heart disease Father    Hyperlipidemia Father    Alcohol abuse Sister    Breast cancer Daughter    Esophageal cancer Neg Hx    Colon cancer Neg Hx    Rectal cancer Neg Hx    Stomach cancer Neg Hx     Social History   Socioeconomic History   Marital status: Married    Spouse name: Not on file   Number of children: 3   Years of  education: Not on file   Highest education level: Not on file  Occupational History   Occupation: HVAC--disabled   Occupation: Part time maintenance    Comment: [employer]  Tobacco Use   Smoking status: Never   Smokeless tobacco: Never  Vaping Use   Vaping Use: Never used  Substance and Sexual Activity   Alcohol use: Yes    Alcohol/week: 0.0 standard drinks    Comment: "very little"   Drug use: No   Sexual activity: Not on file  Other Topics Concern   Not on file  Social History Narrative   3 daughters   Disabled due to back problems   Caregiver for wife-- and helps with multiple grandchildren      No living will   Would want wife to make decisions---alternate daughter Rise Paganini   Would accept  resuscitation attempts   Not sure about tube feeds--would leave it up to family   Wants to donate any organs possible   Social Determinants of Health   Financial Resource Strain: Not on file  Food Insecurity: Not on file  Transportation Needs: Not on file  Physical Activity: Not on file  Stress: Not on file  Social Connections: Not on file  Intimate Partner Violence: Not on file   Review of Systems Left ear feels clogged at times Eating okay Rare heartburn---doesn't feel this is what his symptoms are    Objective:   Physical Exam Constitutional:      Appearance: Normal appearance.  Cardiovascular:     Rate and Rhythm: Normal rate and regular rhythm.     Pulses: Normal pulses.     Heart sounds: No murmur heard.   No gallop.  Pulmonary:     Effort: Pulmonary effort is normal.     Breath sounds: Normal breath sounds. No wheezing or rales.  Abdominal:     Palpations: Abdomen is soft.     Tenderness: There is no abdominal tenderness.  Musculoskeletal:     Cervical back: Neck supple.     Right lower leg: No edema.     Left lower leg: No edema.  Lymphadenopathy:     Cervical: No cervical adenopathy.  Neurological:     Mental Status: He is alert.           Assessment & Plan:

## 2020-12-17 LAB — COMPREHENSIVE METABOLIC PANEL
ALT: 13 U/L (ref 0–53)
AST: 17 U/L (ref 0–37)
Albumin: 4.6 g/dL (ref 3.5–5.2)
Alkaline Phosphatase: 59 U/L (ref 39–117)
BUN: 21 mg/dL (ref 6–23)
CO2: 31 mEq/L (ref 19–32)
Calcium: 9.6 mg/dL (ref 8.4–10.5)
Chloride: 100 mEq/L (ref 96–112)
Creatinine, Ser: 1.32 mg/dL (ref 0.40–1.50)
GFR: 56.01 mL/min — ABNORMAL LOW (ref >60.00)
Glucose, Bld: 86 mg/dL (ref 70–99)
Potassium: 3.7 mEq/L (ref 3.5–5.1)
Sodium: 139 mEq/L (ref 135–145)
Total Bilirubin: 0.9 mg/dL (ref 0.2–1.2)
Total Protein: 7.3 g/dL (ref 6.0–8.3)

## 2020-12-17 LAB — LIPID PANEL
Cholesterol: 190 mg/dL (ref 0–200)
HDL: 57.1 mg/dL (ref >39.00)
LDL Cholesterol: 109 mg/dL — ABNORMAL HIGH (ref 0–99)
NonHDL: 133.35
Total CHOL/HDL Ratio: 3
Triglycerides: 124 mg/dL (ref 0.0–149.0)
VLDL: 24.8 mg/dL (ref 0.0–40.0)

## 2020-12-17 LAB — CBC
HCT: 39.8 % (ref 39.0–52.0)
Hemoglobin: 13.2 g/dL (ref 13.0–17.0)
MCHC: 33.1 g/dL (ref 30.0–36.0)
MCV: 96.2 fl (ref 78.0–100.0)
Platelets: 325 10*3/uL (ref 150.0–400.0)
RBC: 4.14 Mil/uL — ABNORMAL LOW (ref 4.22–5.81)
RDW: 15.4 % (ref 11.5–15.5)
WBC: 11.9 10*3/uL — ABNORMAL HIGH (ref 4.0–10.5)

## 2020-12-17 LAB — T4, FREE: Free T4: 0.82 ng/dL (ref 0.60–1.60)

## 2020-12-20 ENCOUNTER — Ambulatory Visit: Payer: Medicare Other | Admitting: Cardiology

## 2020-12-20 ENCOUNTER — Other Ambulatory Visit: Payer: Self-pay

## 2020-12-20 ENCOUNTER — Encounter: Payer: Self-pay | Admitting: Cardiology

## 2020-12-20 ENCOUNTER — Inpatient Hospital Stay: Payer: Medicare Other

## 2020-12-20 VITALS — BP 146/83 | HR 53 | Temp 97.3°F | Resp 16 | Ht 68.0 in | Wt 142.4 lb

## 2020-12-20 DIAGNOSIS — R072 Precordial pain: Secondary | ICD-10-CM | POA: Diagnosis not present

## 2020-12-20 DIAGNOSIS — I10 Essential (primary) hypertension: Secondary | ICD-10-CM | POA: Diagnosis not present

## 2020-12-20 DIAGNOSIS — R002 Palpitations: Secondary | ICD-10-CM

## 2020-12-20 DIAGNOSIS — R42 Dizziness and giddiness: Secondary | ICD-10-CM | POA: Diagnosis not present

## 2020-12-20 NOTE — Progress Notes (Signed)
Date:  12/20/2020   ID:  Jeffrey Mcbride, DOB 08/28/1955, MRN [id number]  PCP:  Venia Carbon, MD  Cardiologist:  Rex Kras, DO, [facility] (established care 12/20/20 )  REASON FOR CONSULT: Chest pain  REQUESTING PHYSICIAN:  Venia Carbon, MD [place],  [place] [address]  Chief Complaint  Patient presents with   New Patient (Initial Visit)    Chest tightness    HPI  Jeffrey Mcbride is a 65 y.o. male who presents to the office with a chief complaint of " chest tightness." Patient's past medical history and cardiovascular risk factors include: Hx of COVID 07/2020, HTN, Back Pain.   He is referred to the office at the request of Venia Carbon, MD for evaluation of Chest Pain.  Patient presents today for evaluation of what he describes chest tightness over the anterior precordium.  Present over the last 6 months; however, more noticeable now.  Intensity is 5 out of 10, last for a few seconds, and is usually self-limited.  No improving or worsening factors.  However at times he has noticed fluttering/palpitations which then leads to chest tightness after overexertion or with sexual intercourse.  Patient had voiced these concerns with his PCP and is now referred to cardiology for further evaluation and management.  No known history of coronary artery disease/prior coronary intervention/DVT/PE/stroke/TIA/congestive heart failure.  FUNCTIONAL STATUS: Currently works at the [employer] and is constantly on his feet.  And does like to resistance training daily basis.  ALLERGIES: Allergies  Allergen Reactions   Motrin [Ibuprofen] Other (See Comments)    High doses gives pt gas   Naprosyn [Naproxen] Other (See Comments)    Elevated blood pressure    MEDICATION LIST PRIOR TO VISIT: Current Meds  Medication Sig   diphenhydrAMINE (BENADRYL) 25 MG tablet Take 25 mg by mouth as needed.   oxyCODONE-acetaminophen (ROXICET) 5-325 MG tablet Take 1 tablet  by mouth 3 (three) times daily as needed for severe pain.     PAST MEDICAL HISTORY: Past Medical History:  Diagnosis Date   Allergic rhinitis due to pollen    Back pain    Diverticulosis    External hemorrhoids    Hypertension    Insomnia    Internal hemorrhoids    Lumbar back pain with radiculopathy affecting left lower extremity    Tubular adenoma of colon     PAST SURGICAL HISTORY: Past Surgical History:  Procedure Laterality Date   BACK SURGERY     LUMBAR DISC SURGERY  2004   LUMBAR FUSION     Dr Ronnald Ramp   VASECTOMY      FAMILY HISTORY: The patient family history includes Alcohol abuse in his sister; Breast cancer in his daughter; Heart disease in his father and mother; Hyperlipidemia in his father and mother.  SOCIAL HISTORY:  The patient  reports that he has never smoked. He has never used smokeless tobacco. He reports current alcohol use. He reports that he does not use drugs.  REVIEW OF SYSTEMS: Review of Systems  Constitutional: Negative for chills and fever.  HENT:  Negative for hoarse voice and nosebleeds.   Eyes:  Negative for discharge, double vision and pain.  Cardiovascular:  Positive for palpitations. Negative for chest pain, claudication, dyspnea on exertion, leg swelling, near-syncope, orthopnea, paroxysmal nocturnal dyspnea and syncope.  Respiratory:  Negative for hemoptysis and shortness of breath.   Musculoskeletal:  Negative for muscle cramps and myalgias.  Gastrointestinal:  Negative for  abdominal pain, constipation, diarrhea, hematemesis, hematochezia, melena, nausea and vomiting.  Neurological:  Positive for dizziness and light-headedness.  Psychiatric/Behavioral:         Anxiety   PHYSICAL EXAM: Vitals with BMI 12/20/2020 12/16/2020 07/29/2020  Height 5\' 8"  5' 8.5" -  Weight 142 lbs 6 oz 143 lbs 145 lbs  BMI 00.93 81.82 -  Systolic 993 716 967  Diastolic 83 80 77  Pulse 53 55 73   Orthostatic VS for the past 72 hrs (Last 3 readings):   Orthostatic BP Patient Position BP Location Cuff Size Orthostatic Pulse  12/20/20 1049 142/79 Standing Left Arm Normal 57  12/20/20 1048 139/70 Sitting Left Arm Normal 54  12/20/20 1046 159/81 Supine Left Arm Normal 51   CONSTITUTIONAL: Well-developed and well-nourished. No acute distress.  SKIN: Skin is warm and dry. No rash noted. No cyanosis. No pallor. No jaundice HEAD: Normocephalic and atraumatic.  EYES: No scleral icterus MOUTH/THROAT: Moist oral membranes.  NECK: No JVD present. No thyromegaly noted. No carotid bruits  LYMPHATIC: No visible cervical adenopathy.  CHEST Normal respiratory effort. No intercostal retractions  LUNGS: Clear to auscultation bilaterally.  No stridor. No wheezes. No rales.  CARDIOVASCULAR: Regular, positive S1-S2, no murmurs rubs or gallops appreciated ABDOMINAL: No apparent ascites.  EXTREMITIES: No peripheral edema  HEMATOLOGIC: No significant bruising NEUROLOGIC: Oriented to person, place, and time. Nonfocal. Normal muscle tone.  PSYCHIATRIC: Normal mood and affect. Normal behavior. Cooperative  CARDIAC DATABASE: EKG: 12/20/2020: Sinus bradycardia, 52 bpm, normal axis, with sinus arrhythmia, without underlying injury pattern.  Echocardiogram: No results found for this or any previous visit from the past 1095 days.   Stress Testing: No results found for this or any previous visit from the past 1095 days.  Heart Catheterization: None  LABORATORY DATA: CBC Latest Ref Rng & Units 12/16/2020 01/17/2020 10/18/2018  WBC 4.0 - 10.5 K/uL 11.9(H) 7.8 9.7  Hemoglobin 13.0 - 17.0 g/dL 13.2 13.4 14.8  Hematocrit 39.0 - 52.0 % 39.8 39.5 44.5  Platelets 150.0 - 400.0 K/uL 325.0 276.0 300.0    CMP Latest Ref Rng & Units 12/16/2020 01/17/2020 10/18/2018  Glucose 70 - 99 mg/dL 86 93 90  BUN 6 - 23 mg/dL 21 13 19   Creatinine 0.40 - 1.50 mg/dL 1.32 1.32 1.28  Sodium 135 - 145 mEq/L 139 139 140  Potassium 3.5 - 5.1 mEq/L 3.7 3.8 4.1  Chloride 96 - 112 mEq/L  100 102 102  CO2 19 - 32 mEq/L 31 31 28   Calcium 8.4 - 10.5 mg/dL 9.6 9.2 9.9  Total Protein 6.0 - 8.3 g/dL 7.3 7.2 7.7  Total Bilirubin 0.2 - 1.2 mg/dL 0.9 0.7 0.6  Alkaline Phos 39 - 117 U/L 59 68 87  AST 0 - 37 U/L 17 18 19   ALT 0 - 53 U/L 13 14 18     Lipid Panel     Component Value Date/Time   CHOL 190 12/16/2020 1519   TRIG 124.0 12/16/2020 1519   HDL 57.10 12/16/2020 1519   CHOLHDL 3 12/16/2020 1519   VLDL 24.8 12/16/2020 1519   LDLCALC 109 (H) 12/16/2020 1519    No components found for: NTPROBNP No results for input(s): PROBNP in the last 8760 hours. No results for input(s): TSH in the last 8760 hours.  BMP Recent Labs    01/17/20 1143 12/16/20 1519  NA 139 139  K 3.8 3.7  CL 102 100  CO2 31 31  GLUCOSE 93 86  BUN 13 21  CREATININE 1.32  1.32  CALCIUM 9.2 9.6    HEMOGLOBIN A1C No results found for: HGBA1C, MPG  IMPRESSION:    ICD-10-CM   1. Precordial pain  R07.2 EKG 12-Lead    PCV ECHOCARDIOGRAM COMPLETE    PCV CARDIAC STRESS TEST    CT CARDIAC SCORING ([facility] LOCATIONS ONLY)    2. Palpitations  R00.2 LONG TERM MONITOR (3-14 DAYS)    3. Orthostatic lightheadedness  R42     4. Benign hypertension  I10        RECOMMENDATIONS: Dalin Caldera is a 65 y.o. male whose past medical history and cardiac risk factors include: Hx of COVID 07/2020, HTN, Back Pain.   Precordial pain Symptoms with both typical and atypical features Usually brought on by over exertional activities or sexual intercourse. EKG nonischemic. Echocardiogram will be ordered to evaluate for structural heart disease and left ventricular systolic function. Exercise treadmill stress test to evaluate for functional status and exercise-induced ischemia. Coronary calcium score for further restratification.  Palpitations Extended Holter monitor to evaluate for dysrhythmias. Would recommend checking TSH.  Orthostatic lightheadedness Orthostatic vital signs are  positive. Currently not on antihypertensive medications. Patient is educated on wearing compression stockings, keeping himself well-hydrated, changing positions slowly.  Benign hypertension Office blood pressures are currently not at goal. Given his orthostatic lightheadedness We will hold off on addition of antihypertensive medications right now I have asked him to keep a log of his blood pressures and to bring it in at the next office visit or when he sees his PCP. Monitor for now.    FINAL MEDICATION LIST END OF ENCOUNTER: No orders of the defined types were placed in this encounter.   Medications Discontinued During This Encounter  Medication Reason   ibuprofen (ADVIL,MOTRIN) 200 MG tablet Error   tiZANidine (ZANAFLEX) 4 MG tablet Error     Current Outpatient Medications:    diphenhydrAMINE (BENADRYL) 25 MG tablet, Take 25 mg by mouth as needed., Disp: , Rfl:    oxyCODONE-acetaminophen (ROXICET) 5-325 MG tablet, Take 1 tablet by mouth 3 (three) times daily as needed for severe pain., Disp: 75 tablet, Rfl: 0  Orders Placed This Encounter  Procedures   CT CARDIAC SCORING ([facility] LOCATIONS ONLY)   PCV CARDIAC STRESS TEST   LONG TERM MONITOR (3-14 DAYS)   EKG 12-Lead   PCV ECHOCARDIOGRAM COMPLETE    There are no Patient Instructions on file for this visit.   --Continue cardiac medications as reconciled in final medication list. --Return in about 6 weeks (around 01/31/2021) for Follow up, Chest pain, Review test results. Or sooner if needed. --Continue follow-up with your primary care physician regarding the management of your other chronic comorbid conditions.  Patient's questions and concerns were addressed to his satisfaction. He voices understanding of the instructions provided during this encounter.   This note was created using a voice recognition software as a result there may be grammatical errors inadvertently enclosed that do not reflect the nature of this encounter.  Every attempt is made to correct such errors.  Rex Kras, [place], [facility]  Pager: [phone] Office: [phone]

## 2020-12-31 ENCOUNTER — Other Ambulatory Visit: Payer: Self-pay | Admitting: Internal Medicine

## 2020-12-31 NOTE — Telephone Encounter (Signed)
  Encourage patient to contact the pharmacy for refills or they can request refills through [place]:  Please schedule appointment if longer than 1 year  NEXT APPOINTMENT DATE:  MEDICATION:oxyCODONE-acetaminophen (ROXICET) 5-325 MG tablet  Is the patient out of medication?   PHARMACY:[id number] [place], [place] - [id number]   Let patient know to contact pharmacy at the end of the day to make sure medication is ready.  Please notify patient to allow 48-72 hours to process  CLINICAL FILLS OUT ALL BELOW:   LAST REFILL:  QTY:  REFILL DATE:    OTHER COMMENTS:    Okay for refill?  Please advise

## 2021-01-01 DIAGNOSIS — R002 Palpitations: Secondary | ICD-10-CM | POA: Diagnosis not present

## 2021-01-01 MED ORDER — OXYCODONE-ACETAMINOPHEN 5-325 MG PO TABS: 1.0000 | ORAL_TABLET | Freq: Three times a day (TID) | ORAL | 0 refills | Status: DC | PRN

## 2021-01-01 NOTE — Telephone Encounter (Signed)
Name of Medication: Oxycodone Name of Pharmacy[facility] or Written Date and Quantity: 10-21-20 #75 Last Office Visit and Type: 12-20-20 Next Office Visit and Type: 01-22-21 Last Controlled Substance Agreement Date: 01-17-20 Last UDS: 01-17-20

## 2021-01-08 ENCOUNTER — Other Ambulatory Visit: Payer: Medicare Other

## 2021-01-09 ENCOUNTER — Ambulatory Visit: Payer: Medicare Other | Admitting: Cardiology

## 2021-01-09 ENCOUNTER — Ambulatory Visit
Admission: RE | Admit: 2021-01-09 | Discharge: 2021-01-09 | Disposition: A | Discharge status: Home / Self Care | Payer: No Typology Code available for payment source | Source: Ambulatory Visit | Attending: Cardiology | Admitting: Cardiology

## 2021-01-09 DIAGNOSIS — R072 Precordial pain: Secondary | ICD-10-CM

## 2021-01-10 ENCOUNTER — Other Ambulatory Visit: Payer: Medicare Other

## 2021-01-15 NOTE — Progress Notes (Signed)
No answer left a vm

## 2021-01-15 NOTE — Progress Notes (Signed)
Patient called back, I have discussed results with him.

## 2021-01-16 ENCOUNTER — Other Ambulatory Visit: Payer: Self-pay

## 2021-01-16 ENCOUNTER — Ambulatory Visit: Payer: Medicare Other

## 2021-01-16 DIAGNOSIS — R072 Precordial pain: Secondary | ICD-10-CM

## 2021-01-22 ENCOUNTER — Encounter: Payer: Self-pay | Admitting: Internal Medicine

## 2021-01-22 ENCOUNTER — Other Ambulatory Visit: Payer: Self-pay

## 2021-01-22 ENCOUNTER — Ambulatory Visit (INDEPENDENT_AMBULATORY_CARE_PROVIDER_SITE_OTHER): Payer: Medicare Other | Admitting: Internal Medicine

## 2021-01-22 VITALS — BP 120/70 | HR 49 | Temp 97.7°F | Ht 68.0 in | Wt 142.0 lb

## 2021-01-22 DIAGNOSIS — Z7189 Other specified counseling: Secondary | ICD-10-CM | POA: Diagnosis not present

## 2021-01-22 DIAGNOSIS — Z0000 Encounter for general adult medical examination without abnormal findings: Secondary | ICD-10-CM

## 2021-01-22 DIAGNOSIS — N1831 Chronic kidney disease, stage 3a: Secondary | ICD-10-CM | POA: Diagnosis not present

## 2021-01-22 DIAGNOSIS — F1120 Opioid dependence, uncomplicated: Secondary | ICD-10-CM

## 2021-01-22 DIAGNOSIS — I10 Essential (primary) hypertension: Secondary | ICD-10-CM

## 2021-01-22 DIAGNOSIS — M5416 Radiculopathy, lumbar region: Secondary | ICD-10-CM | POA: Diagnosis not present

## 2021-01-22 NOTE — Progress Notes (Signed)
Subjective:    Patient ID: Jeffrey Mcbride, male    DOB: 12-Oct-1955, 65 y.o.   MRN: [id number]  HPI Here for Medicare wellness visit  This visit occurred during the SARS-CoV-2 public health emergency.  Safety protocols were in place, including screening questions prior to the visit, additional usage of staff PPE, and extensive cleaning of exam room while observing appropriate contact time as indicated for disinfecting solutions.   Reviewed advanced directives Reviewed other doctors---Dr Tolia--cardiologist, Dr Annice Pih, [facility] No hospitalizations or surgery in past year Vision is fine Hearing is pretty good Trying to exercise---walking at work and does some weights Rare sip of moonshine (to help him sleep) No tobacco No falls No depression or anhedonia Independent with instrumental ADLs No memory problems  Had cardiology evaluation---calcium score 50%, stress test negative No recent chest pain---just gets DOE if he pushes it (and head pounding) No recent dizziness. No syncope No edema  Back pain is actually better since working regularly Not using the oxycodone as much--deals with the pain  Has some spots on his scalp he wants checked-- found after he went hunting Scabs--but not painful No facial rash  Last GFR is 56  Current Outpatient Medications on File Prior to Visit  Medication Sig Dispense Refill   diphenhydrAMINE (BENADRYL) 25 MG tablet Take 25 mg by mouth as needed.     oxyCODONE-acetaminophen (ROXICET) 5-325 MG tablet Take 1 tablet by mouth 3 (three) times daily as needed for severe pain. 75 tablet 0   No current facility-administered medications on file prior to visit.    Allergies  Allergen Reactions   Motrin [Ibuprofen] Other (See Comments)    High doses gives pt gas   Naprosyn [Naproxen] Other (See Comments)    Elevated blood pressure    Past Medical History:  Diagnosis Date   Allergic rhinitis due to pollen    Back pain     Diverticulosis    External hemorrhoids    Hypertension    Insomnia    Internal hemorrhoids    Lumbar back pain with radiculopathy affecting left lower extremity    Tubular adenoma of colon     Past Surgical History:  Procedure Laterality Date   BACK SURGERY     LUMBAR DISC SURGERY  2004   LUMBAR FUSION     Dr Ronnald Ramp   VASECTOMY      Family History  Problem Relation Age of Onset   Heart disease Mother    Hyperlipidemia Mother    Heart disease Father    Hyperlipidemia Father    Alcohol abuse Sister    Breast cancer Daughter    Esophageal cancer Neg Hx    Colon cancer Neg Hx    Rectal cancer Neg Hx    Stomach cancer Neg Hx     Social History   Socioeconomic History   Marital status: Married    Spouse name: Not on file   Number of children: 3   Years of education: Not on file   Highest education level: Not on file  Occupational History   Occupation: HVAC--disabled   Occupation: Part time maintenance    Comment: [employer]  Tobacco Use   Smoking status: Never   Smokeless tobacco: Never  Vaping Use   Vaping Use: Never used  Substance and Sexual Activity   Alcohol use: Yes    Alcohol/week: 0.0 standard drinks    Comment: "very little"   Drug use: No   Sexual activity: Not on file  Other Topics Concern   Not on file  Social History Narrative   3 daughters   Disabled due to back problems   Caregiver for wife-- and helps with multiple grandchildren      No living will   Would want wife to make decisions---alternate daughter Rise Paganini   Would accept resuscitation attempts   Not sure about tube feeds--doesn't want if cognitively unaware   Wants to donate any organs possible   Social Determinants of Health   Financial Resource Strain: Not on file  Food Insecurity: Not on file  Transportation Needs: Not on file  Physical Activity: Not on file  Stress: Not on file  Social Connections: Not on file  Intimate Partner Violence: Not on file   Review of  Systems Appetite is okay Weight is stable--hasn't been able to gain back the 7-10# Still uses benedryl at times to help sleep Needs dental work and extractions---financial issues with this Some ankle throbbing at the end of his work day No other skin concerns No heartburn or dysphagia Bowels are moving fine. Some hemorrhoid issues--occ blood on TP Voids okay---stream is fine. Nocturia x 1 only    Objective:   Physical Exam Constitutional:      Appearance: Normal appearance.  HENT:     Mouth/Throat:     Comments: No lesions Eyes:     Conjunctiva/sclera: Conjunctivae normal.     Pupils: Pupils are equal, round, and reactive to light.  Cardiovascular:     Rate and Rhythm: Normal rate and regular rhythm.     Pulses: Normal pulses.     Heart sounds: No murmur heard.   No gallop.  Pulmonary:     Effort: Pulmonary effort is normal.     Breath sounds: Normal breath sounds. No wheezing or rales.  Abdominal:     Palpations: Abdomen is soft.     Tenderness: There is no abdominal tenderness.  Musculoskeletal:     Cervical back: Neck supple.     Right lower leg: No edema.     Left lower leg: No edema.  Lymphadenopathy:     Cervical: No cervical adenopathy.  Skin:    Comments: 2 open areas on scalp--not infected (will try cortisone lotion he has)  Neurological:     Mental Status: He is alert and oriented to person, place, and time.     Comments: President---"Biden, Trump, Obama" [id number] D-[id number]-w Recall 2/3  Psychiatric:        Mood and Affect: Mood normal.        Behavior: Behavior normal.           Assessment & Plan:

## 2021-01-22 NOTE — Assessment & Plan Note (Signed)
BP Readings from Last 3 Encounters:  01/22/21 120/70  12/20/20 (!) 146/83  12/16/20 118/80   Okay without meds Recent cardiac work up fairly reassuring (so will hold off on meds like beta blocker)

## 2021-01-22 NOTE — Assessment & Plan Note (Addendum)
I have personally reviewed the Medicare Annual Wellness questionnaire and have noted 1. The patient's medical and social history 2. Their use of alcohol, tobacco or illicit drugs 3. Their current medications and supplements 4. The patient's functional ability including ADL's, fall risks, home safety risks and hearing or visual             impairment. 5. Diet and physical activities 6. Evidence for depression or mood disorders  The patients weight, height, BMI and visual acuity have been recorded in the chart I have made referrals, counseling and provided education to the patient based review of the above and I have provided the pt with a written personalized care plan for preventive services.  I have provided you with a copy of your personalized plan for preventive services. Please take the time to review along with your updated medication list.  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

## 2021-01-22 NOTE — Progress Notes (Signed)
Vision Screening   Right eye Left eye Both eyes  Without correction     With correction 20/10 20/10 20/10   Hearing Screening - Comments:: Audiometry equipment failed

## 2021-01-22 NOTE — Assessment & Plan Note (Signed)
Borderline low GFR No action for now 

## 2021-01-22 NOTE — Assessment & Plan Note (Signed)
See social history 

## 2021-01-22 NOTE — Assessment & Plan Note (Signed)
PDMP reviewed No concerns 

## 2021-01-22 NOTE — Assessment & Plan Note (Signed)
Pain actually better since he is working Using the oxycodone much less

## 2021-01-27 DIAGNOSIS — R002 Palpitations: Secondary | ICD-10-CM | POA: Diagnosis not present

## 2021-01-27 NOTE — Progress Notes (Signed)
Pt wants to know if he still needs to come to his follow up visit 02/03/2021 since we called him for the results.

## 2021-01-28 LAB — DRUG MONITORING, PANEL 8 WITH CONFIRMATION, URINE
6 Acetylmorphine: NEGATIVE ng/mL (ref <10)
Alcohol Metabolites: NEGATIVE ng/mL (ref <500)
Amphetamines: NEGATIVE ng/mL (ref <500)
Benzodiazepines: NEGATIVE ng/mL (ref <100)
Buprenorphine, Urine: NEGATIVE ng/mL (ref <5)
Cocaine Metabolite: NEGATIVE ng/mL (ref <150)
Codeine: NEGATIVE ng/mL (ref <50)
Creatinine: 102 mg/dL (ref >=20.0)
Hydrocodone: NEGATIVE ng/mL (ref <50)
Hydromorphone: NEGATIVE ng/mL (ref <50)
MDMA: NEGATIVE ng/mL (ref <500)
Marijuana Metabolite: 2017 ng/mL — ABNORMAL HIGH (ref <5)
Marijuana Metabolite: POSITIVE ng/mL — AB (ref <20)
Morphine: NEGATIVE ng/mL (ref <50)
Norhydrocodone: NEGATIVE ng/mL (ref <50)
Noroxycodone: 915 ng/mL — ABNORMAL HIGH (ref <50)
Opiates: NEGATIVE ng/mL (ref <100)
Oxidant: NEGATIVE ug/mL (ref <200)
Oxycodone: 167 ng/mL — ABNORMAL HIGH (ref <50)
Oxycodone: POSITIVE ng/mL — AB (ref <100)
Oxymorphone: 421 ng/mL — ABNORMAL HIGH (ref <50)
pH: 6.2 (ref 4.5–9.0)

## 2021-01-28 LAB — DM TEMPLATE

## 2021-02-03 ENCOUNTER — Ambulatory Visit: Payer: Medicare Other | Admitting: Cardiology

## 2021-03-07 ENCOUNTER — Other Ambulatory Visit: Payer: Self-pay | Admitting: Internal Medicine

## 2021-03-07 NOTE — Telephone Encounter (Signed)
  Encourage patient to contact the pharmacy for refills or they can request refills through [place]:  Please schedule appointment if longer than 1 year  NEXT APPOINTMENT DATE:07/24/2021  MEDICATION:oxyCODONE-acetaminophen (ROXICET) 5-325 MG tablet  Is the patient out of medication? yes  [id number] [place], [place] [address]  Let patient know to contact pharmacy at the end of the day to make sure medication is ready.  Please notify patient to allow 48-72 hours to process  CLINICAL FILLS OUT ALL BELOW:   LAST REFILL:  QTY:  REFILL DATE:    OTHER COMMENTS:    Okay for refill?  Please advise

## 2021-03-10 MED ORDER — OXYCODONE-ACETAMINOPHEN 5-325 MG PO TABS: 1.0000 | ORAL_TABLET | Freq: Three times a day (TID) | ORAL | 0 refills | Status: DC | PRN

## 2021-03-10 NOTE — Telephone Encounter (Signed)
Name of Medication: Oxycodone Name of [facility]acy[facility] or Written Date and Quantity: 01-01-21 #75 Last Office Visit and Type: 01-22-21 Next Office Visit and Type: 07-24-21 Last Controlled Substance Agreement Date: 01-22-21 Last UDS: 01-22-21

## 2021-03-10 NOTE — Telephone Encounter (Signed)
Pt called to know status of refill 

## 2021-04-28 ENCOUNTER — Other Ambulatory Visit: Payer: Self-pay

## 2021-04-28 MED ORDER — OXYCODONE-ACETAMINOPHEN 5-325 MG PO TABS: 1.0000 | ORAL_TABLET | Freq: Three times a day (TID) | ORAL | 0 refills | Status: DC | PRN

## 2021-04-28 NOTE — Telephone Encounter (Signed)
Name of Medication: Oxycodone Name of Pharmacy: [facility] or Written Date and Quantity: 03-10-21 #75 Last Office Visit and Type: 01-22-21 Next Office Visit and Type: 07-24-21 Last Controlled Substance Agreement Date: 01-22-21 Last UDS: 01-22-21

## 2021-06-04 ENCOUNTER — Other Ambulatory Visit: Payer: Self-pay | Admitting: Internal Medicine

## 2021-06-04 NOTE — Telephone Encounter (Signed)
Pt called today and said he need refill on medication oxycodone ?

## 2021-06-04 NOTE — Telephone Encounter (Signed)
Name of Medication: Oxycodone ?[facility] ?Last Fill or Written Date and Quantity: 04/28/21 #75 ?Last Office Visit and Type: 01/22/21 ?Next Office Visit and Type: 07/24/21 ?Last Controlled Substance Agreement Date: 04/21/21 ?Last UDS:01/22/21 ? ? ?

## 2021-06-06 MED ORDER — OXYCODONE-ACETAMINOPHEN 5-325 MG PO TABS: 1.0000 | ORAL_TABLET | Freq: Three times a day (TID) | ORAL | 0 refills | Status: DC | PRN

## 2021-07-02 ENCOUNTER — Encounter (HOSPITAL_COMMUNITY): Payer: Self-pay | Admitting: Emergency Medicine

## 2021-07-02 ENCOUNTER — Emergency Department (HOSPITAL_COMMUNITY)
Admission: EM | Admit: 2021-07-02 | Discharge: 2021-07-03 | Disposition: A | Discharge status: Home / Self Care | Payer: No Typology Code available for payment source | Attending: Emergency Medicine | Admitting: Emergency Medicine

## 2021-07-02 ENCOUNTER — Other Ambulatory Visit: Payer: Self-pay

## 2021-07-02 DIAGNOSIS — Y93E5 Activity, floor mopping and cleaning: Secondary | ICD-10-CM | POA: Diagnosis not present

## 2021-07-02 DIAGNOSIS — S40021A Contusion of right upper arm, initial encounter: Secondary | ICD-10-CM | POA: Diagnosis not present

## 2021-07-02 DIAGNOSIS — Z23 Encounter for immunization: Secondary | ICD-10-CM | POA: Diagnosis not present

## 2021-07-02 DIAGNOSIS — S0993XA Unspecified injury of face, initial encounter: Secondary | ICD-10-CM | POA: Diagnosis present

## 2021-07-02 DIAGNOSIS — S01332A Puncture wound without foreign body of left ear, initial encounter: Secondary | ICD-10-CM | POA: Insufficient documentation

## 2021-07-02 DIAGNOSIS — Y990 Civilian activity done for income or pay: Secondary | ICD-10-CM | POA: Diagnosis not present

## 2021-07-02 DIAGNOSIS — S0183XA Puncture wound without foreign body of other part of head, initial encounter: Secondary | ICD-10-CM | POA: Diagnosis not present

## 2021-07-02 DIAGNOSIS — S0181XA Laceration without foreign body of other part of head, initial encounter: Secondary | ICD-10-CM | POA: Diagnosis not present

## 2021-07-02 MED ORDER — TETANUS-DIPHTH-ACELL PERTUSSIS 5-2.5-18.5 LF-MCG/0.5 IM SUSY
0.5000 mL | PREFILLED_SYRINGE | Freq: Once | INTRAMUSCULAR | Status: AC
  Administered 2021-07-03: 0.5 mL via INTRAMUSCULAR
  Filled 2021-07-02: qty 0.5
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
## 2021-07-03 ENCOUNTER — Emergency Department (HOSPITAL_COMMUNITY): Payer: No Typology Code available for payment source

## 2021-07-03 MED ORDER — OXYCODONE-ACETAMINOPHEN 5-325 MG PO TABS
0.5000 | ORAL_TABLET | Freq: Once | ORAL | Status: AC
  Administered 2021-07-03: 0.5 via ORAL
  Filled 2021-07-03: qty 1

## 2021-07-03 MED ORDER — ACETAMINOPHEN 325 MG PO TABS
325.0000 mg | ORAL_TABLET | Freq: Once | ORAL | Status: AC
  Administered 2021-07-03: 325 mg via ORAL
  Filled 2021-07-03: qty 1

## 2021-07-03 NOTE — Discharge Instructions (Signed)
The CAT scans today look normal without any sign of internal bleeding or broken bones.  It will be important for you to get an ice pack on your ear and apply pressure to help with the swelling.  Continue to take your pain medication at home as needed.  Rest for the next few days. ?
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
## 2021-07-08 ENCOUNTER — Telehealth: Payer: Self-pay

## 2021-07-08 NOTE — Telephone Encounter (Signed)
Patient notified as instructed by telephone and verbalized understanding. Patient stated that he will talk more with Dr. Silvio Pate about it tomorrow.  ?

## 2021-07-08 NOTE — Telephone Encounter (Signed)
Pt requesting ED FU appt with Dr Silvio Pate; pt was seen at [facility] ED on 07/02/21 and was to FU with DR Silvio Pate in one wk. Pt said still ear pain and lump on rt side of head is better but still there. Pt scheduled appt with Dr Silvio Pate on 07/09/21 at 9 AM. Pt wants to verify it is OK for pt to return to work.sending note to Dr Silvio Pate and Larene Beach CMA. ?

## 2021-07-09 ENCOUNTER — Ambulatory Visit (INDEPENDENT_AMBULATORY_CARE_PROVIDER_SITE_OTHER): Payer: Self-pay | Admitting: Internal Medicine

## 2021-07-09 ENCOUNTER — Encounter: Payer: Self-pay | Admitting: Internal Medicine

## 2021-07-09 DIAGNOSIS — T07XXXA Unspecified multiple injuries, initial encounter: Secondary | ICD-10-CM | POA: Insufficient documentation

## 2021-07-09 MED ORDER — LORAZEPAM 0.5 MG PO TABS: 0.2500 mg | ORAL_TABLET | Freq: Two times a day (BID) | ORAL | 0 refills | Status: DC | PRN

## 2021-07-09 NOTE — Assessment & Plan Note (Signed)
On head ?Ongoing pain/dizziness/headache ?May have mild concussion---- discussed delaying return to work till headaches, etc are better ?Anxiety from this---will give lorazepam 0.5 for sleep/nerves ?
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
## 2021-07-11 ENCOUNTER — Telehealth: Payer: Self-pay | Admitting: Internal Medicine

## 2021-07-11 NOTE — Telephone Encounter (Signed)
Pt said he wants to return to work on this Monday 07/14/21, pt said he isn't good with Mychart so can we either send letter to directly to [employer] (job) if not can we print it out for pt to pick up and give to job.  ?

## 2021-07-11 NOTE — Telephone Encounter (Signed)
Pt stated that he is ready to go back to work and he need a letter to return to work : company name is [employer] -[address] [place] ,[place] and phone # is [phone] ?

## 2021-07-11 NOTE — Telephone Encounter (Signed)
Called and lvm for pt call us back with date to when he his going to return to work. ?

## 2021-07-11 NOTE — Telephone Encounter (Signed)
Left detailed message letting patient know that work note was placed up front for pick up ?

## 2021-07-14 ENCOUNTER — Telehealth: Payer: Self-pay | Admitting: Internal Medicine

## 2021-07-14 MED ORDER — OXYCODONE-ACETAMINOPHEN 5-325 MG PO TABS: 1.0000 | ORAL_TABLET | Freq: Three times a day (TID) | ORAL | 0 refills | Status: DC | PRN

## 2021-07-14 NOTE — Telephone Encounter (Signed)
?  Encourage patient to contact the pharmacy for refills or they can request refills through [facility] ? ?Did the patient contact the pharmacy:  N ? ? ?LAST APPOINTMENT DATE:  4.12.23 ? ?NEXT APPOINTMENT DATE: 4.27.23 ? ?MEDICATION:  oxyCODONE-acetaminophen (ROXICET) 5-325 MG tablet ? ?Is the patient out of medication?   ? ?If not, how much is left? ? ?Is this a 90 day supply:  ? ?[facility]   [id number] [place] [place]  [phone] ?Fax:  [phone] ?  ? ? ?Let patient know to contact pharmacy at the end of the day to make sure medication is ready. ? ?Please notify patient to allow 48-72 hours to process ?  ?

## 2021-07-14 NOTE — Telephone Encounter (Signed)
Is this okay to refill? 

## 2021-07-15 ENCOUNTER — Telehealth: Payer: Self-pay | Admitting: Internal Medicine

## 2021-07-15 NOTE — Telephone Encounter (Signed)
Jeffrey Mcbride with [employer] called stating that she needs pt medical records and OV notes for pt Workmen Comp. Please advise. ?

## 2021-07-21 NOTE — Telephone Encounter (Signed)
I do not know how we are doing our medical records or if there is a log that tells [place] if a request was sent to medical records. I will forward to Amy to find out what to do. ?

## 2021-07-24 ENCOUNTER — Ambulatory Visit: Payer: Medicare Other | Admitting: Internal Medicine

## 2021-07-25 ENCOUNTER — Encounter: Payer: Self-pay | Admitting: Internal Medicine

## 2021-07-25 ENCOUNTER — Ambulatory Visit (INDEPENDENT_AMBULATORY_CARE_PROVIDER_SITE_OTHER): Payer: Medicare Other | Admitting: Internal Medicine

## 2021-07-25 DIAGNOSIS — F1120 Opioid dependence, uncomplicated: Secondary | ICD-10-CM

## 2021-07-25 DIAGNOSIS — M5416 Radiculopathy, lumbar region: Secondary | ICD-10-CM

## 2021-07-25 MED ORDER — METHOCARBAMOL 500 MG PO TABS: 500.0000 mg | ORAL_TABLET | Freq: Two times a day (BID) | ORAL | 1 refills | Status: DC | PRN

## 2021-07-25 NOTE — Assessment & Plan Note (Signed)
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

## 2021-07-25 NOTE — Progress Notes (Signed)
? ?Subjective:  ? ? Patient ID: Jeffrey Mcbride, male    DOB: [birth year], 66 y.o.   MRN: [id number] ? ?HPI ?Here for follow up of chronic pain and narcotic dependence ? ?Has improved ?Some left ear pain ?Small knot on left forehead ?Mild mental cloudiness--but has gotten back to work ? ?Same chronic back pain ?Uses the norco---since it gets worse when he overdoes it with work ?May switch jobs ---looking at another service/maintenance job ? ?Marital issue---broke up with wife in October ?Now dating--she comes to the house intermittently ? ?Wants refill on methocarbamol ?Uses this sporadically ?Discussed using just at bedtime ? ?Current Outpatient Medications on File Prior to Visit  ?Medication Sig Dispense Refill  ? ibuprofen (ADVIL) 200 MG tablet Take 200 mg by mouth every 6 (six) hours as needed.    ? LORazepam (ATIVAN) 0.5 MG tablet Take 0.5-1 tablets (0.25-0.5 mg total) by mouth 2 (two) times daily as needed for anxiety. 15 tablet 0  ? oxyCODONE-acetaminophen (ROXICET) 5-325 MG tablet Take 1 tablet by mouth 3 (three) times daily as needed for severe pain. 75 tablet 0  ? ?No current facility-administered medications on file prior to visit.  ? ? ?Allergies  ?Allergen Reactions  ? Motrin [Ibuprofen] Other (See Comments)  ?  High doses gives pt gas  ? Naprosyn [Naproxen] Other (See Comments)  ?  Elevated blood pressure  ? ? ?Past Medical History:  ?Diagnosis Date  ? Allergic rhinitis due to pollen   ? Back pain   ? Diverticulosis   ? External hemorrhoids   ? Hypertension   ? Insomnia   ? Internal hemorrhoids   ? Lumbar back pain with radiculopathy affecting left lower extremity   ? Tubular adenoma of colon   ? ? ?Past Surgical History:  ?Procedure Laterality Date  ? BACK SURGERY    ? [place] SURGERY  2004  ? LUMBAR FUSION    ? Dr Ronnald Ramp  ? VASECTOMY    ? ? ?Family History  ?Problem Relation Age of Onset  ? Heart disease Mother   ? Hyperlipidemia Mother   ? Heart disease Father   ? Hyperlipidemia Father   ?  Alcohol abuse Sister   ? Breast cancer Daughter   ? Esophageal cancer Neg Hx   ? Colon cancer Neg Hx   ? Rectal cancer Neg Hx   ? Stomach cancer Neg Hx   ? ? ?Social History  ? ?Socioeconomic History  ? Marital status: Married  ?  Spouse name: Not on file  ? Number of children: 3  ? Years of education: Not on file  ? Highest education level: Not on file  ?Occupational History  ? Occupation: HVAC--disabled  ? Occupation: Part time maintenance  ?  Comment: [employer]  ?Tobacco Use  ? Smoking status: Never  ? Smokeless tobacco: Never  ?Vaping Use  ? Vaping Use: Never used  ?Substance and Sexual Activity  ? Alcohol use: Yes  ?  Alcohol/week: 0.0 standard drinks  ?  Comment: "very little"  ? Drug use: No  ? Sexual activity: Not on file  ?Other Topics Concern  ? Not on file  ?Social History Narrative  ? 3 daughters  ? Disabled due to back problems  ? 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   ? No living will  ? Would want wife to make decisions---alternate daughter Rise Paganini  ? Would accept resuscitation attempts  ? Not sure about tube feeds--doesn't want if  cognitively unaware  ? Wants to donate any organs possible  ? ?Social Determinants of Health  ? ?Financial Resource Strain: Not on file  ?Food Insecurity: Not on file  ?Transportation Needs: Not on file  ?Physical Activity: Not on file  ?Stress: Not on file  ?Social Connections: Not on file  ?Intimate Partner Violence: Not on file  ? ?Review of Systems ?Sleeping okay in general ?Some GI upset recently---left side---seems better now ?   ?Objective:  ? Physical Exam ?Constitutional:   ?   Appearance: Normal appearance.  ?Neurological:  ?   Mental Status: He is alert.  ?Psychiatric:     ?   Mood and Affect: Mood normal.     ?   Behavior: Behavior normal.  ?  ? ? ? ? ?   ?Assessment & Plan:  ? ?

## 2021-07-25 NOTE — Assessment & Plan Note (Signed)
PDMP reviewed No concerns 

## 2021-08-27 ENCOUNTER — Other Ambulatory Visit: Payer: Self-pay | Admitting: Internal Medicine

## 2021-08-27 MED ORDER — OXYCODONE-ACETAMINOPHEN 5-325 MG PO TABS: 1.0000 | ORAL_TABLET | Freq: Three times a day (TID) | ORAL | 0 refills | Status: DC | PRN

## 2021-08-27 NOTE — Telephone Encounter (Signed)
Name of Medication: Oxycodone [facility] Last Fill or Written Date and Quantity: 07/14/21 #75 Last Office Visit and Type: 01/22/21 Next Office Visit and Type: 07/24/21 Last Controlled Substance Agreement Date: 01/22/21 Last UDS:01/22/21

## 2021-08-27 NOTE — Telephone Encounter (Signed)
Caller Name: Jakob Kimberlin  Call back phone #: [phone]  MEDICATION(S): oxyCODONE-acetaminophen (ROXICET) 5-325 MG tablet   Days of Med Remaining: 2 days left  Has the patient contacted their pharmacy (YES/NO)?  No, controlled med IF YES, when and what did the pharmacy advise?  IF NO, request that the patient contact the pharmacy for the refills in the future.             The pharmacy will send an electronic request (except for controlled medications).  [facility] [place]  ~~~Please advise patient/caregiver to allow 2-3 business days to process RX refills.

## 2021-09-10 ENCOUNTER — Other Ambulatory Visit: Payer: Self-pay | Admitting: Internal Medicine

## 2021-09-10 MED ORDER — METHOCARBAMOL 500 MG PO TABS: 500.0000 mg | ORAL_TABLET | Freq: Two times a day (BID) | ORAL | 1 refills | Status: DC | PRN

## 2021-09-10 NOTE — Telephone Encounter (Signed)
Caller Name: Rondal Vandevelde Call back phone #: [phone]  MEDICATION(S): methocarbamol (ROBAXIN) 500 MG tablet   Days of Med Remaining: Out of it  Has the patient contacted their pharmacy (YES/NO)?  no IF YES, when and what did the pharmacy advise?  IF NO, request that the patient contact the pharmacy for the refills in the future.             The pharmacy will send an electronic request (except for controlled medications).  [facility] [place]  ~~~Please advise patient/caregiver to allow 2-3 business days to process RX refills.

## 2021-09-10 NOTE — Telephone Encounter (Signed)
Last refill 06/2821 #60/1 Last office visit 07/25/21

## 2021-10-06 ENCOUNTER — Other Ambulatory Visit: Payer: Self-pay | Admitting: Internal Medicine

## 2021-10-06 MED ORDER — OXYCODONE-ACETAMINOPHEN 5-325 MG PO TABS: 1.0000 | ORAL_TABLET | Freq: Three times a day (TID) | ORAL | 0 refills | Status: DC | PRN

## 2021-10-06 NOTE — Telephone Encounter (Signed)
Name of Medication: Oxycodone [facility] Last Fill or Written Date and Quantity: 08/27/21 #75 Last Office Visit and Type: 07/25/21 Next Office Visit and Type: 10/28/21 Last Controlled Substance Agreement Date: 01/22/21 Last UDS:01/22/21

## 2021-10-06 NOTE — Telephone Encounter (Signed)
Caller Name: Shelia, Magallon Call back phone #: [phone]  MEDICATION(S): oxyCODONE-acetaminophen (ROXICET) 5-325 MG tablet   Days of Med Remaining: 1 day left  Has the patient contacted their pharmacy (YES/NO)?  No, controlled IF YES, when and what did the pharmacy advise?  IF NO, request that the patient contact the pharmacy for the refills in the future.             The pharmacy will send an electronic request (except for controlled medications).  [facility] [place]   ~~~Please advise patient/caregiver to allow 2-3 business days to process RX refills.

## 2021-10-07 MED ORDER — OXYCODONE-ACETAMINOPHEN 5-325 MG PO TABS: 1.0000 | ORAL_TABLET | Freq: Three times a day (TID) | ORAL | 0 refills | Status: DC | PRN

## 2021-10-07 NOTE — Addendum Note (Signed)
Addended by: Pilar Grammes on: 10/07/2021 07:24 AM   Modules accepted: Orders

## 2021-10-07 NOTE — Addendum Note (Signed)
Addended by: Viviana Simpler I on: 10/07/2021 12:35 PM   Modules accepted: Orders

## 2021-10-14 ENCOUNTER — Telehealth: Payer: Self-pay | Admitting: Internal Medicine

## 2021-10-14 NOTE — Telephone Encounter (Signed)
Spoke to pt. Advised him we do not have the vaccines he will need for Yellow Fever. Gave him the number to [facility] in [place] as they will have what he needs. He stated he called them this morning. He will try again later.

## 2021-10-14 NOTE — Telephone Encounter (Signed)
Pt called in stated he willing need vaccination because he traveling  to [place] appointment schedule for 10/28/21 Please advise

## 2021-10-28 ENCOUNTER — Encounter: Payer: Self-pay | Admitting: Internal Medicine

## 2021-10-28 ENCOUNTER — Ambulatory Visit (INDEPENDENT_AMBULATORY_CARE_PROVIDER_SITE_OTHER): Payer: Medicare Other | Admitting: Internal Medicine

## 2021-10-28 DIAGNOSIS — M5416 Radiculopathy, lumbar region: Secondary | ICD-10-CM

## 2021-10-28 DIAGNOSIS — F39 Unspecified mood [affective] disorder: Secondary | ICD-10-CM | POA: Insufficient documentation

## 2021-10-28 DIAGNOSIS — F1120 Opioid dependence, uncomplicated: Secondary | ICD-10-CM | POA: Diagnosis not present

## 2021-10-28 NOTE — Assessment & Plan Note (Signed)
Ongoing daily pain but able to work using the oxycodone

## 2021-10-28 NOTE — Assessment & Plan Note (Signed)
PDMP reviewed No concerns 

## 2021-10-28 NOTE — Assessment & Plan Note (Signed)
5 year marriage fell apart---devastated him Finally moving on Very hopeful with new relationship --but hasn't met her in person yet

## 2021-10-28 NOTE — Progress Notes (Signed)
Subjective:    Patient ID: Jeffrey Mcbride, male    DOB: [birth year], 66 y.o.   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     LORazepam (ATIVAN) 0.5 MG tablet Take 0.5-1 tablets (0.25-0.5 mg total) by mouth 2 (two) times daily as needed for anxiety. 15 tablet 0   methocarbamol (ROBAXIN) 500 MG tablet Take 1 tablet (500 mg total) by mouth 2 (two) times daily as needed for muscle spasms. 60 tablet 1   oxyCODONE-acetaminophen (ROXICET) 5-325 MG tablet Take 1 tablet by mouth 3 (three) times daily as needed for severe pain. 75 tablet 0   No current facility-administered medications on file prior to visit.    Allergies  Allergen Reactions   Motrin [Ibuprofen] Other (See Comments)    High doses gives pt gas   Naprosyn [Naproxen] Other (See Comments)    Elevated blood pressure    Past Medical History:  Diagnosis Date   Allergic rhinitis due to pollen    Back pain    Diverticulosis    External hemorrhoids    Hypertension    Insomnia    Internal hemorrhoids    Lumbar back pain with radiculopathy affecting left lower extremity    Tubular adenoma of colon     Past Surgical History:  Procedure Laterality Date   BACK SURGERY     [place]UM[place]C SURGERY  2004   LUMBAR FUSION     Dr Ronnald Ramp   VASECTOMY      Family History  Problem Relation Age of Onset   Heart disease Mother     Hyperlipidemia Mother    Heart disease Father    Hyperlipidemia Father    Alcohol abuse Sister    Breast cancer Daughter    Esophageal cancer Neg Hx    Colon cancer Neg Hx    Rectal cancer Neg Hx    Stomach cancer Neg Hx     Social History   Socioeconomic History   Marital status: Married    Spouse name: Not on file   Number of children: 3   Years of education: Not on file   Highest education level: Not on file  Occupational History   Occupation: HVAC--disabled   Occupation: Part time maintenance    Comment: [employer]  Tobacco Use   Smoking status: Never   Smokeless tobacco: Never  Vaping Use   Vaping Use: Never used  Substance and Sexual Activity   Alcohol use: Yes    Alcohol/week: 0.0 standard drinks of alcohol    Comment: "very little"   Drug use: No   Sexual activity: Not on file  Other Topics Concern   Not on file  Social History Narrative   3 daughters   Disabled due to back problems   Caregiver for wife-- and helps with multiple grandchildren      No living will  Would want wife to make decisions---alternate daughter Rise Paganini   Would accept resuscitation attempts   Not sure about tube feeds--doesn't want if cognitively unaware   Wants to donate any organs possible   Social Determinants of Health   Financial Resource Strain: Not on file  Food Insecurity: Not on file  Transportation Needs: Not on file  Physical Activity: Not on file  Stress: Not on file  Social Connections: Not on file  Intimate Partner Violence: Not on file   Review of Systems Marriage still seems to not be working out--likely will file for divorce soon Planning possible trip to [place]---has met woman from [place] on line May want to move there     Objective:   Physical Exam Constitutional:      Appearance: Normal appearance.  Neurological:     Mental Status: He is alert.  Psychiatric:        Mood and Affect: Mood normal.        Behavior: Behavior normal.             Assessment & Plan:

## 2021-11-06 ENCOUNTER — Other Ambulatory Visit: Payer: Self-pay | Admitting: Internal Medicine

## 2021-11-06 NOTE — Telephone Encounter (Signed)
  Encourage patient to contact the pharmacy for refills or they can request refills through [facility]  Did the patient contact the pharmacy: No   LAST APPOINTMENT DATE: 10/28/21  NEXT APPOINTMENT DATE: 02/10/22  MEDICATION: oxyCODONE-acetaminophen (ROXICET) 5-325 MG tablet  Is the patient out of medication? No  If not, how much is left? 2 left  [id number] [place] [place] [address] Let patient know to contact pharmacy at the end of the day to make sure medication is ready.  Please notify patient to allow 48-72 hours to process

## 2021-11-07 MED ORDER — OXYCODONE-ACETAMINOPHEN 5-325 MG PO TABS: 1.0000 | ORAL_TABLET | Freq: Three times a day (TID) | ORAL | 0 refills | Status: DC | PRN

## 2021-11-07 NOTE — Telephone Encounter (Signed)
Name of Medication: Oxycodone [facility] Last Fill or Written Date and Quantity: 10/07/21 #75 Last Office Visit and Type: 8/1//23 Next Office Visit and Type: 02/10/22 Last Controlled Substance Agreement Date: 01/22/21 Last UDS:01/22/21

## 2021-11-07 NOTE — Addendum Note (Signed)
Addended by: Pilar Grammes on: 11/07/2021 11:04 AM   Modules accepted: Orders

## 2021-11-07 NOTE — Addendum Note (Signed)
Addended by: Viviana Simpler I on: 11/07/2021 03:31 PM   Modules accepted: Orders

## 2021-12-12 ENCOUNTER — Other Ambulatory Visit: Payer: Self-pay | Admitting: Internal Medicine

## 2021-12-12 NOTE — Telephone Encounter (Signed)
  Encourage patient to contact the pharmacy for refills or they can request refills through [facility]  Did the patient contact the pharmacy: n    LAST APPOINTMENT DATE:  10/28/21  NEXT APPOINTMENT DATE:  MEDICATION:methocarbamol (ROBAXIN) 500 MG tablet oxyCODONE-acetaminophen (ROXICET) 5-325 MG tablet  Is the patient out of medication? y  If not, how much is left?  Is this a 90 day supply: (85) (58)  [id number] [place] [place] [address]:  [phone] Fax:  [phone]     Let patient know to contact pharmacy at the end of the day to make sure medication is ready.  Please notify patient to allow 48-72 hours to process

## 2021-12-15 MED ORDER — OXYCODONE-ACETAMINOPHEN 5-325 MG PO TABS: 1.0000 | ORAL_TABLET | Freq: Three times a day (TID) | ORAL | 0 refills | Status: DC | PRN

## 2021-12-15 MED ORDER — LORAZEPAM 0.5 MG PO TABS: 0.2500 mg | ORAL_TABLET | Freq: Two times a day (BID) | ORAL | 0 refills | Status: DC | PRN

## 2021-12-15 NOTE — Telephone Encounter (Signed)
Name of Medication: Oxycodone [facility] Last Fill or Written Date and Quantity: 11/07/21 #75 Last Office Visit and Type: 8/1//23 Next Office Visit and Type: 02/10/22 Last Controlled Substance Agreement Date: 01/22/21 Last UDS:01/22/21  Methocarbamol #60 09-10-21

## 2022-01-19 ENCOUNTER — Other Ambulatory Visit: Payer: Self-pay | Admitting: Internal Medicine

## 2022-01-19 MED ORDER — OXYCODONE-ACETAMINOPHEN 5-325 MG PO TABS: 1.0000 | ORAL_TABLET | Freq: Three times a day (TID) | ORAL | 0 refills | Status: DC | PRN

## 2022-01-19 NOTE — Telephone Encounter (Signed)
  Encourage patient to contact the pharmacy for refills or they can request refills through [facility]:  Please schedule appointment if longer than 1 year  NEXT APPOINTMENT DATE:  MEDICATION:oxyCODONE-acetaminophen (ROXICET) 5-325 MG tablet   Is the patient out of medication?   [id number] [place]    Let patient know to contact pharmacy at the end of the day to make sure medication is ready.  Please notify patient to allow 48-72 hours to process  CLINICAL FILLS OUT ALL BELOW:   LAST REFILL:  QTY:  REFILL DATE:    OTHER COMMENTS:    Okay for refill?  Please advise

## 2022-01-19 NOTE — Telephone Encounter (Signed)
Name of Medication: Oxycodone [facility] Last Fill or Written Date and Quantity: 12/15/21 #75 Last Office Visit and Type: 10/28/21 Next Office Visit and Type: 02/10/22 Last Controlled Substance Agreement Date: 01/22/21 Last UDS:01/22/21

## 2022-02-10 ENCOUNTER — Encounter: Payer: Medicare Other | Admitting: Internal Medicine

## 2022-02-12 ENCOUNTER — Ambulatory Visit (INDEPENDENT_AMBULATORY_CARE_PROVIDER_SITE_OTHER): Payer: Medicare Other | Admitting: Internal Medicine

## 2022-02-12 ENCOUNTER — Encounter: Payer: Self-pay | Admitting: Internal Medicine

## 2022-02-12 VITALS — BP 138/80 | HR 49 | Temp 97.5°F | Ht 68.5 in | Wt 147.0 lb

## 2022-02-12 DIAGNOSIS — M5416 Radiculopathy, lumbar region: Secondary | ICD-10-CM | POA: Diagnosis not present

## 2022-02-12 DIAGNOSIS — F39 Unspecified mood [affective] disorder: Secondary | ICD-10-CM | POA: Diagnosis not present

## 2022-02-12 DIAGNOSIS — Z0000 Encounter for general adult medical examination without abnormal findings: Secondary | ICD-10-CM | POA: Diagnosis not present

## 2022-02-12 DIAGNOSIS — N1831 Chronic kidney disease, stage 3a: Secondary | ICD-10-CM | POA: Diagnosis not present

## 2022-02-12 DIAGNOSIS — F1120 Opioid dependence, uncomplicated: Secondary | ICD-10-CM

## 2022-02-12 DIAGNOSIS — K649 Unspecified hemorrhoids: Secondary | ICD-10-CM

## 2022-02-12 DIAGNOSIS — Z125 Encounter for screening for malignant neoplasm of prostate: Secondary | ICD-10-CM

## 2022-02-12 DIAGNOSIS — I10 Essential (primary) hypertension: Secondary | ICD-10-CM

## 2022-02-12 LAB — CBC
HCT: 42.3 % (ref 39.0–52.0)
Hemoglobin: 14.2 g/dL (ref 13.0–17.0)
MCHC: 33.5 g/dL (ref 30.0–36.0)
MCV: 95.2 fl (ref 78.0–100.0)
Platelets: 335 10*3/uL (ref 150.0–400.0)
RBC: 4.45 Mil/uL (ref 4.22–5.81)
RDW: 14.8 % (ref 11.5–15.5)
WBC: 7.6 10*3/uL (ref 4.0–10.5)

## 2022-02-12 LAB — HEPATIC FUNCTION PANEL
ALT: 14 U/L (ref 0–53)
AST: 18 U/L (ref 0–37)
Albumin: 4.8 g/dL (ref 3.5–5.2)
Alkaline Phosphatase: 82 U/L (ref 39–117)
Bilirubin, Direct: 0.1 mg/dL (ref 0.0–0.3)
Total Bilirubin: 0.8 mg/dL (ref 0.2–1.2)
Total Protein: 7.4 g/dL (ref 6.0–8.3)

## 2022-02-12 LAB — RENAL FUNCTION PANEL
Albumin: 4.8 g/dL (ref 3.5–5.2)
BUN: 14 mg/dL (ref 6–23)
CO2: 31 mEq/L (ref 19–32)
Calcium: 9.2 mg/dL (ref 8.4–10.5)
Chloride: 104 mEq/L (ref 96–112)
Creatinine, Ser: 1.31 mg/dL (ref 0.40–1.50)
GFR: 56.06 mL/min — ABNORMAL LOW (ref >60.00)
Glucose, Bld: 91 mg/dL (ref 70–99)
Phosphorus: 2.6 mg/dL (ref 2.3–4.6)
Potassium: 4 mEq/L (ref 3.5–5.1)
Sodium: 142 mEq/L (ref 135–145)

## 2022-02-12 LAB — PSA, MEDICARE: PSA: 3.89 ng/ml (ref 0.10–4.00)

## 2022-02-12 NOTE — Assessment & Plan Note (Signed)
Lots of stress this year---especially separation from wife Working this out now Has lorazepam for prn use

## 2022-02-12 NOTE — Assessment & Plan Note (Signed)
I have personally reviewed the Medicare Annual Wellness questionnaire and have noted 1. The patient's medical and social history 2. Their use of alcohol, tobacco or illicit drugs 3. Their current medications and supplements 4. The patient's functional ability including ADL's, fall risks, home safety risks and hearing or visual             impairment. 5. Diet and physical activities 6. Evidence for depression or mood disorders  The patients weight, height, BMI and visual acuity have been recorded in the chart I have made referrals, counseling and provided education to the patient based review of the above and I have provided the pt with a written personalized care plan for preventive services.  I have provided you with a copy of your personalized plan for preventive services. Please take the time to review along with your updated medication list.  Colon due 2026--but going back to GI due to hemorrhoidal bleeding Will check PSA Stays active at work Prefers no flu/pneumonia/shingles/COVID vaccines

## 2022-02-12 NOTE — Assessment & Plan Note (Signed)
Borderline abnormal Will recheck and have him stop ibuprofen is still abnormal

## 2022-02-12 NOTE — Progress Notes (Signed)
Hearing Screening - Comments:: Passed whisper test Vision Screening - Comments:: December 2022

## 2022-02-12 NOTE — Assessment & Plan Note (Signed)
Is willing to consider banding now if an option
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
## 2022-02-12 NOTE — Assessment & Plan Note (Signed)
PDMP reviewed No concerns 

## 2022-02-12 NOTE — Assessment & Plan Note (Signed)
Exacerbated at work Uses the oxycodone twice a day usually Has the robaxin for prn also

## 2022-02-12 NOTE — Assessment & Plan Note (Signed)
BP Readings from Last 3 Encounters:  02/12/22 138/80  10/28/21 138/80  07/25/21 140/80   Still okay without meds

## 2022-02-14 LAB — DRUG MONITORING, PANEL 8 WITH CONFIRMATION, URINE
6 Acetylmorphine: NEGATIVE ng/mL (ref <10)
Alcohol Metabolites: POSITIVE ng/mL — AB (ref <500)
Amphetamines: NEGATIVE ng/mL (ref <500)
Benzodiazepines: NEGATIVE ng/mL (ref <100)
Buprenorphine, Urine: NEGATIVE ng/mL (ref <5)
Cocaine Metabolite: NEGATIVE ng/mL (ref <150)
Codeine: NEGATIVE ng/mL (ref <50)
Creatinine: 169.2 mg/dL (ref >=20.0)
Ethyl Glucuronide (ETG): 1730 ng/mL — ABNORMAL HIGH (ref <500)
Ethyl Sulfate (ETS): 236 ng/mL — ABNORMAL HIGH (ref <100)
Hydrocodone: NEGATIVE ng/mL (ref <50)
Hydromorphone: NEGATIVE ng/mL (ref <50)
MDMA: NEGATIVE ng/mL (ref <500)
Marijuana Metabolite: 3052 ng/mL — ABNORMAL HIGH (ref <5)
Marijuana Metabolite: POSITIVE ng/mL — AB (ref <20)
Morphine: NEGATIVE ng/mL (ref <50)
Norhydrocodone: NEGATIVE ng/mL (ref <50)
Noroxycodone: 1347 ng/mL — ABNORMAL HIGH (ref <50)
Opiates: NEGATIVE ng/mL (ref <100)
Oxidant: NEGATIVE ug/mL (ref <200)
Oxycodone: 101 ng/mL — ABNORMAL HIGH (ref <50)
Oxycodone: POSITIVE ng/mL — AB (ref <100)
Oxymorphone: 926 ng/mL — ABNORMAL HIGH (ref <50)
pH: 7.3 (ref 4.5–9.0)

## 2022-02-14 LAB — DM TEMPLATE

## 2022-02-24 ENCOUNTER — Other Ambulatory Visit: Payer: Self-pay | Admitting: Internal Medicine

## 2022-02-24 NOTE — Telephone Encounter (Signed)
Caller Name: Junah Call back phone #: [phone]  MEDICATION(S):  oxyCODONE-acetaminophen (ROXICET) 5-325 MG tablet   Days of Med Remaining: 1  Has the patient contacted their pharmacy (YES/NO)? YES What did pharmacy advise?   [facility]  [id number] [place] [place]  ~~~Please advise patient/caregiver to allow 2-3 business days to process RX refills.

## 2022-02-24 NOTE — Telephone Encounter (Signed)
Name of Medication: Oxycodone Name of [facility] Last Fill or Written Date and Quantity: 01/19/22 #75 Last Office Visit and Type: 02/12/22 Next Office Visit and Type: 05/15/22 Last Controlled Substance Agreement Date: 01/22/21 Last UDS:01/22/21

## 2022-02-25 MED ORDER — OXYCODONE-ACETAMINOPHEN 5-325 MG PO TABS: 1.0000 | ORAL_TABLET | Freq: Three times a day (TID) | ORAL | 0 refills | Status: DC | PRN

## 2022-03-12 ENCOUNTER — Encounter: Payer: Self-pay | Admitting: Internal Medicine

## 2022-03-12 ENCOUNTER — Ambulatory Visit (INDEPENDENT_AMBULATORY_CARE_PROVIDER_SITE_OTHER): Payer: Medicare Other | Admitting: Internal Medicine

## 2022-03-12 VITALS — BP 138/82 | HR 58 | Temp 98.3°F | Ht 68.5 in | Wt 153.0 lb

## 2022-03-12 DIAGNOSIS — S39011A Strain of muscle, fascia and tendon of abdomen, initial encounter: Secondary | ICD-10-CM | POA: Insufficient documentation

## 2022-03-12 NOTE — Progress Notes (Signed)
Subjective:    Patient ID: Jeffrey Mcbride, male    DOB: January 10, 1956, 66 y.o.   MRN: [id number]  HPI Here due to stomach cramps and other issues  Keeps being pressed to "work too hard" at work Having to [employer], etc Strained his core and started with increased bleeding hemorrhoids (nothing new but worse) With excess lifting--will get strain across mid abdomen  Gets itchy sensation inside as well  Current Outpatient Medications on File Prior to Visit  Medication Sig Dispense Refill   ibuprofen (ADVIL) 200 MG tablet Take 200 mg by mouth every 6 (six) hours as needed.     LORazepam (ATIVAN) 0.5 MG tablet Take 0.5-1 tablets (0.25-0.5 mg total) by mouth 2 (two) times daily as needed for anxiety. 15 tablet 0   methocarbamol (ROBAXIN) 500 MG tablet Take 1 tablet (500 mg total) by mouth 2 (two) times daily as needed for muscle spasms. 60 tablet 1   oxyCODONE-acetaminophen (ROXICET) 5-325 MG tablet Take 1 tablet by mouth 3 (three) times daily as needed for severe pain. 75 tablet 0   No current facility-administered medications on file prior to visit.    Allergies  Allergen Reactions   Motrin [Ibuprofen] Other (See Comments)    High doses gives pt gas   Naprosyn [Naproxen] Other (See Comments)    Elevated blood pressure    Past Medical History:  Diagnosis Date   Allergic rhinitis due to pollen    Back pain    Diverticulosis    External hemorrhoids    Hypertension    Insomnia    Internal hemorrhoids    Lumbar back pain with radiculopathy affecting left lower extremity    Tubular adenoma of colon     Past Surgical History:  Procedure Laterality Date   BACK SURGERY     LUMBAR DISC SURGERY  2004   LUMBAR FUSION     Dr Ronnald Ramp   VASECTOMY      Family History  Problem Relation Age of Onset   Heart disease Mother    Hyperlipidemia Mother    Heart disease Father    Hyperlipidemia Father    Alcohol abuse Sister    Breast cancer Daughter    Esophageal cancer  Neg Hx    Colon cancer Neg Hx    Rectal cancer Neg Hx    Stomach cancer Neg Hx     Social History   Socioeconomic History   Marital status: Married    Spouse name: Not on file   Number of children: 3   Years of education: Not on file   Highest education level: Not on file  Occupational History   Occupation: HVAC--disabled   Occupation: Part time maintenance    Comment: [employer]  Tobacco Use   Smoking status: Never    Passive exposure: Never   Smokeless tobacco: Never  Vaping Use   Vaping Use: Never used  Substance and Sexual Activity   Alcohol use: Yes    Alcohol/week: 0.0 standard drinks of alcohol    Comment: "very little"   Drug use: No   Sexual activity: Not on file  Other Topics Concern   Not on file  Social History Narrative   3 daughters   Disabled due to back problems   Caregiver for wife-- and helps with multiple grandchildren      No living will   Would want wife to make decisions---alternate daughter Rise Paganini   Would accept resuscitation attempts   Not sure about tube  feeds--doesn't want if cognitively unaware   Wants to donate any organs possible   Social Determinants of Health   Financial Resource Strain: Not on file  Food Insecurity: Not on file  Transportation Needs: Not on file  Physical Activity: Not on file  Stress: Not on file  Social Connections: Not on file  Intimate Partner Violence: Not on file   Review of Systems No N/V Eating okay Bowels move okay    Objective:   Physical Exam Constitutional:      Appearance: Normal appearance.  Abdominal:     Palpations: Abdomen is soft. There is no mass.     Tenderness: There is no guarding or rebound.     Hernia: No hernia is present.     Comments: Sensitivity in abdominal wall but no hernia or sig tenderness  Musculoskeletal:     Comments: Mild spine tenderness--nothing new  Neurological:     Mental Status: He is alert.            Assessment & Plan:

## 2022-03-12 NOTE — Assessment & Plan Note (Signed)
Discussed safe lifting, etc---needs to have help with some of the lifting work Reassured--no hernia or internal injury Will have him stay out until 12/18

## 2022-04-07 ENCOUNTER — Other Ambulatory Visit: Payer: Self-pay | Admitting: Internal Medicine

## 2022-04-07 MED ORDER — OXYCODONE-ACETAMINOPHEN 5-325 MG PO TABS: 1.0000 | ORAL_TABLET | Freq: Three times a day (TID) | ORAL | 0 refills | Status: DC | PRN

## 2022-04-07 NOTE — Telephone Encounter (Signed)
Name of Medication: Oxycodone Name of [facility] Last Fill or Written Date and Quantity: 02/25/22 #75 Last Office Visit and Type: 02/12/22 Next Office Visit and Type: 05/15/22 Last Controlled Substance Agreement Date: 02-12-22 Last UDS:02-12-22

## 2022-04-07 NOTE — Telephone Encounter (Signed)
Prescription Request  04/07/2022  Is this a "Controlled Substance" medicine? No  LOV: 03/12/2022  What is the name of the medication or equipment? oxyCODONE-acetaminophen (ROXICET) 5-325 MG tablet   Have you contacted your pharmacy to request a refill? No   Which pharmacy would you like this sent to?  [id number] [id number] [address]Phone: [phone]: [phone]   Patient notified that their request is being sent to the clinical staff for review and that they should receive a response within 2 business days.   Please advise at Mobile [phone](mobile)

## 2022-04-15 ENCOUNTER — Ambulatory Visit: Payer: Medicare Other | Admitting: Internal Medicine

## 2022-05-01 ENCOUNTER — Ambulatory Visit: Payer: Medicare Other | Admitting: Nurse Practitioner

## 2022-05-01 NOTE — Progress Notes (Deleted)
Assessment    Patient profile:  Jeffrey Mcbride is a 67 y.o. male known to Dr. Hilarie Fredrickson with a past medical history of colon polyps. See PMH /PSH for additional history  # History of adenomatous colon polyps. Last colonoscopy September 2021, a 5-year follow up was recommended.   Plan       HPI    Chief complaint:      Interval History:      Previous GI Evaluation   Sept 2021 surveillance colonoscopy -Two 3 to 4 mm polyps in the descending colon, removed. A 4 mm polyp removed from the sigmoid colon.  Sigmoid diverticulosis Path - tubular adenomas ( 3)  Imaging     Labs:     Latest Ref Rng & Units 02/12/2022   10:08 AM 12/16/2020    3:19 PM 01/17/2020   11:43 AM  CBC  WBC 4.0 - 10.5 K/uL 7.6  11.9  7.8   Hemoglobin 13.0 - 17.0 g/dL 14.2  13.2  13.4   Hematocrit 39.0 - 52.0 % 42.3  39.8  39.5   Platelets 150.0 - 400.0 K/uL 335.0  325.0  276.0        Latest Ref Rng & Units 02/12/2022   10:08 AM 12/16/2020    3:19 PM 01/17/2020   11:43 AM  Hepatic Function  Total Protein 6.0 - 8.3 g/dL 7.4  7.3  7.2   Albumin 3.5 - 5.2 g/dL 3.5 - 5.2 g/dL 4.8    4.8  4.6  4.6   AST 0 - 37 U/L '18  17  18   '$ ALT 0 - 53 U/L '14  13  14   '$ Alk Phosphatase 39 - 117 U/L 82  59  68   Total Bilirubin 0.2 - 1.2 mg/dL 0.8  0.9  0.7   Bilirubin, Direct 0.0 - 0.3 mg/dL 0.1        Past Medical History:  Diagnosis Date   Allergic rhinitis due to pollen    Back pain    Diverticulosis    External hemorrhoids    Hypertension    Insomnia    Internal hemorrhoids    Lumbar back pain with radiculopathy affecting left lower extremity    Tubular adenoma of colon     Past Surgical History:  Procedure Laterality Date   BACK SURGERY     LUMBAR DISC SURGERY  2004   LUMBAR FUSION     Dr Ronnald Ramp   VASECTOMY      Current Medications, Allergies, Family History and Social History were reviewed in [facility] record.     Current Outpatient Medications   Medication Sig Dispense Refill   ibuprofen (ADVIL) 200 MG tablet Take 200 mg by mouth every 6 (six) hours as needed.     methocarbamol (ROBAXIN) 500 MG tablet Take 1 tablet (500 mg total) by mouth 2 (two) times daily as needed for muscle spasms. 60 tablet 1   oxyCODONE-acetaminophen (ROXICET) 5-325 MG tablet Take 1 tablet by mouth 3 (three) times daily as needed for severe pain. 75 tablet 0   No current facility-administered medications for this visit.    Review of Systems: No chest pain. No shortness of breath. No urinary complaints.    Physical Exam  Wt Readings from Last 3 Encounters:  03/12/22 153 lb (69.4 kg)  02/12/22 147 lb (66.7 kg)  10/28/21 143 lb (64.9 kg)    There were no vitals taken for this visit. Constitutional:  Pleasant, generally well appearing ***  male in no acute distress. Psychiatric: Normal mood and affect. Behavior is normal. EENT: Pupils normal.  Conjunctivae are normal. No scleral icterus. Neck supple.  Cardiovascular: Normal rate, regular rhythm.  Pulmonary/chest: Effort normal and breath sounds normal. No wheezing, rales or rhonchi. Abdominal: Soft, nondistended, nontender. Bowel sounds active throughout. There are no masses palpable. No hepatomegaly. Neurological: Alert and oriented to person place and time. Musculoskeletal:  Extremities: *** edema Skin: Skin is warm and dry. No rashes noted.  Tye Savoy, NP  05/01/2022, 11:09 AM  Cc:  Venia Carbon, MD

## 2022-05-15 ENCOUNTER — Ambulatory Visit: Payer: Medicare Other | Admitting: Internal Medicine

## 2022-05-21 ENCOUNTER — Ambulatory Visit (INDEPENDENT_AMBULATORY_CARE_PROVIDER_SITE_OTHER): Payer: Medicare Other | Admitting: Internal Medicine

## 2022-05-21 ENCOUNTER — Encounter: Payer: Self-pay | Admitting: Internal Medicine

## 2022-05-21 VITALS — BP 128/72 | HR 56 | Temp 98.0°F | Ht 68.5 in | Wt 155.0 lb

## 2022-05-21 DIAGNOSIS — M5416 Radiculopathy, lumbar region: Secondary | ICD-10-CM

## 2022-05-21 DIAGNOSIS — F1120 Opioid dependence, uncomplicated: Secondary | ICD-10-CM

## 2022-05-21 MED ORDER — OXYCODONE-ACETAMINOPHEN 5-325 MG PO TABS: 1.0000 | ORAL_TABLET | Freq: Three times a day (TID) | ORAL | 0 refills | Status: DC | PRN

## 2022-05-21 NOTE — Assessment & Plan Note (Signed)
PDMP reviewed No concerns 

## 2022-05-21 NOTE — Progress Notes (Signed)
Subjective:    Patient ID: Jeffrey Mcbride, male    DOB: [birth year], 67 y.o.   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     methocarbamol (ROBAXIN) 500 MG tablet Take 1 tablet (500 mg total) by mouth 2 (two) times daily as needed for muscle spasms. 60 tablet 1   oxyCODONE-acetaminophen (ROXICET) 5-325 MG tablet Take 1 tablet by mouth 3 (three) times daily as needed for severe pain. 75 tablet 0   No current facility-administered medications on file prior to visit.    Allergies  Allergen Reactions   Motrin [Ibuprofen] Other (See Comments)    High doses gives pt gas   Naprosyn [Naproxen] Other (See Comments)    Elevated blood pressure    Past Medical History:  Diagnosis Date   Allergic rhinitis due to pollen    Back pain    Diverticulosis    External hemorrhoids    Hypertension    Insomnia    Internal hemorrhoids    Lumbar back pain with radiculopathy affecting left lower extremity    Tubular adenoma of colon     Past Surgical History:  Procedure Laterality Date   BACK SURGERY     LUMBAR DISC SURGERY  2004   LUMBAR FUSION     Dr Ronnald Ramp   VASECTOMY      Family History  Problem Relation Age of Onset   Heart disease Mother    Hyperlipidemia Mother    Heart disease Father    Hyperlipidemia Father     Alcohol abuse Sister    Breast cancer Daughter    Esophageal cancer Neg Hx    Colon cancer Neg Hx    Rectal cancer Neg Hx    Stomach cancer Neg Hx     Social History   Socioeconomic History   Marital status: Married    Spouse name: Not on file   Number of children: 3   Years of education: Not on file   Highest education level: Not on file  Occupational History   Occupation: HVAC--disabled   Occupation: Tax adviser  Tobacco Use   Smoking status: Never    Passive exposure: Never   Smokeless tobacco: Never  Vaping Use   Vaping Use: Never used  Substance and Sexual Activity   Alcohol use: Yes    Alcohol/week: 0.0 standard drinks of alcohol    Comment: "very little"   Drug use: No   Sexual activity: Not on file  Other Topics Concern   Not on file  Social History Narrative   3 daughters   Disabled due to back problems   Caregiver for wife-- and helps with multiple grandchildren      No living will   Would want wife to make decisions---alternate daughter Rise Paganini   Would  accept resuscitation attempts   Not sure about tube feeds--doesn't want if cognitively unaware   Wants to donate any organs possible   Social Determinants of Health   Financial Resource Strain: Not on file  Food Insecurity: Not on file  Transportation Needs: Not on file  Physical Activity: Not on file  Stress: Not on file  Social Connections: Not on file  Intimate Partner Violence: Not on file   Review of Systems Sleeps fairly well with 1/2-3/4 oxycodone Awakening at [place] on his own Occasionally awakens in pain and has to take AM dose Now has 2 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  Neurological:     Mental Status: He is alert.  Psychiatric:        Mood and Affect: Mood normal.        Behavior: Behavior normal.            Assessment & Plan:

## 2022-05-21 NOTE — Assessment & Plan Note (Signed)
Now with less physically challenging job (still has to do ladders, etc) Uses the oxycodone regularly but tries to limit Maintains function

## 2022-06-23 ENCOUNTER — Encounter: Payer: Self-pay | Admitting: Nurse Practitioner

## 2022-06-23 ENCOUNTER — Ambulatory Visit (INDEPENDENT_AMBULATORY_CARE_PROVIDER_SITE_OTHER): Payer: Medicare Other | Admitting: Nurse Practitioner

## 2022-06-23 VITALS — BP 152/76 | HR 53 | Ht 70.2 in | Wt 156.0 lb

## 2022-06-23 DIAGNOSIS — K625 Hemorrhage of anus and rectum: Secondary | ICD-10-CM

## 2022-06-23 DIAGNOSIS — K648 Other hemorrhoids: Secondary | ICD-10-CM | POA: Diagnosis not present

## 2022-06-23 NOTE — Patient Instructions (Signed)
_______________________________________________________  If your blood pressure at your visit was 140/90 or greater, please contact your primary care physician to follow up on this. _______________________________________________________  If you are age 69 or older, your body mass index should be between 23-30. Your Body mass index is 22.26 kg/m. If this is out of the aforementioned range listed, please consider follow up with your Primary Care Provider. ________________________________________________________  The [facility] providers would like to encourage you to use [facility] to communicate with providers for non-urgent requests or questions.  Due to long hold times on the telephone, sending your provider a message by [facility] may be a faster and more efficient way to get a response.  Please allow 48 business hours for a response.  Please remember that this is for non-urgent requests.  _______________________________________________________  Dennis Bast are scheduled to follow up on 07-07-22 at 4pm with Dr Hilarie Fredrickson for 1st hemorrhoid banding.  Thank you for entrusting me with your care and choosing [facility]  Tye Savoy, NP

## 2022-06-23 NOTE — Progress Notes (Signed)
Assessment   67 y.o. yo male with the following:   Symptomatic hemorrhoids Frequent bleeding. Was supposed to come for banding after 2021 colonoscopy.   History of adenomatous colon polyps.  Surveillance colonoscopy due Sept 2026   Plan   -Discussed why hemorrhoids were not banded during colonoscopy.  - He wishes to proceed with internal hemorrhoidal banding as previously discussed with Dr. Hilarie Fredrickson. He thought this was being done at today's visit. Will arrange for banding appt with Dr. Hilarie Fredrickson  History of Present Illness   Chief complaint:  wants hemorrhoids banded. Rectal bleeding with bowel movements.     Jessi Verge is a 68 y.o. male known to Dr. Hilarie Fredrickson with a past medical history of colon polyps, HTN, arthritis, hemorrhoids. See PMH / Philip for additional details.   Hemorrhoids bothering him again. He describes protrusion of hemorrhoids with bleeding. He isn't constipated. He was supposed to come for banding after colonoscopy but things came up in his life and he didn't make it in. He wants to proceed with banding. He didn't understand why hemorrhoids weren't banded during the colonoscopy.    Labs:     Latest Ref Rng & Units 02/12/2022   10:08 AM 12/16/2020    3:19 PM 01/17/2020   11:43 AM  Hepatic Function  Total Protein 6.0 - 8.3 g/dL 7.4  7.3  7.2   Albumin 3.5 - 5.2 g/dL 3.5 - 5.2 g/dL 4.8    4.8  4.6  4.6   AST 0 - 37 U/L 18  17  18    ALT 0 - 53 U/L 14  13  14    Alk Phosphatase 39 - 117 U/L 82  59  68   Total Bilirubin 0.2 - 1.2 mg/dL 0.8  0.9  0.7   Bilirubin, Direct 0.0 - 0.3 mg/dL 0.1          Latest Ref Rng & Units 02/12/2022   10:08 AM 12/16/2020    3:19 PM 01/17/2020   11:43 AM  CBC  WBC 4.0 - 10.5 K/uL 7.6  11.9  7.8   Hemoglobin 13.0 - 17.0 g/dL 14.2  13.2  13.4   Hematocrit 39.0 - 52.0 % 42.3  39.8  39.5   Platelets 150.0 - 400.0 K/uL 335.0  325.0  276.0      Previous GI Evaluation   Sept 2021  surveillance colonoscopy  --Two 3 to 4 mm polyps in the descending colon, removed with a cold snare. Resected and retrieved. - One 4 mm polyp in the sigmoid colon, removed with a cold snare. Resected and retrieved. - Diverticulosis in the sigmoid colon. - External and internal hemorrhoids.  Surgical [P], colon, descending, polyps (3) - TUBULAR ADENOMA WITHOUT HIGH-GRADE DYSPLASIA OR MALIGNANCY - HYPERPLASTIC POLYP(S) Follow up 5 years  Past Medical History:  Diagnosis Date   Allergic rhinitis due to pollen    Back pain    Diverticulosis    External hemorrhoids    Hypertension    Insomnia    Internal hemorrhoids    Lumbar back pain with radiculopathy affecting left lower extremity    Tubular adenoma of colon     Past Surgical History:  Procedure Laterality Date   BACK SURGERY     LUMBAR DISC SURGERY  2004   LUMBAR FUSION     Dr Ronnald Ramp   VASECTOMY      Current Medications, Allergies, Family History and Social History were reviewed in [facility] record.  Current Outpatient Medications  Medication Sig Dispense Refill   ibuprofen (ADVIL) 200 MG tablet Take 200 mg by mouth every 6 (six) hours as needed.     methocarbamol (ROBAXIN) 500 MG tablet Take 1 tablet (500 mg total) by mouth 2 (two) times daily as needed for muscle spasms. 60 tablet 1   oxyCODONE-acetaminophen (ROXICET) 5-325 MG tablet Take 1 tablet by mouth 3 (three) times daily as needed for severe pain. 75 tablet 0   No current facility-administered medications for this visit.    Review of Systems: No chest pain. No shortness of breath. No urinary complaints.    Physical Exam  Wt Readings from Last 3 Encounters:  05/21/22 155 lb (70.3 kg)  03/12/22 153 lb (69.4 kg)  02/12/22 147 lb (66.7 kg)    BP (!) 152/76   Pulse (!) 53   Ht 5' 10.2" (1.783 m)   Wt 156 lb (70.8 kg)   BMI 22.26 kg/m  Constitutional:  Pleasant, generally well appearing male in no acute distress. Psychiatric:  Normal mood and affect. Behavior is normal. EENT: Pupils normal.  Conjunctivae are normal. No scleral icterus. Neck supple.  Cardiovascular: Normal rate, regular rhythm.  Pulmonary/chest: Effort normal and breath sounds normal. No wheezing, rales or rhonchi. Abdominal: Soft, nondistended, nontender. Bowel sounds active throughout. There are no masses palpable.  Rectal: No perianal lesions seen. DRE / anoscopy not done Neurological: Alert and oriented to person place and time.  Skin: Skin is warm and dry. No rashes noted.  Tye Savoy, NP  06/23/2022, 2:45 PM  Cc:  Venia Carbon, MD

## 2022-06-27 NOTE — Progress Notes (Signed)
Addendum: Reviewed and agree with assessment and management plan. Evert Wenrich M, MD  

## 2022-06-30 ENCOUNTER — Other Ambulatory Visit: Payer: Self-pay

## 2022-06-30 NOTE — Telephone Encounter (Signed)
Name of Medication: Oxycodone Name of Pharmacy: [facility] Last Fill or Written Date and Quantity: 05/21/22 #75 Last Office Visit and Type: 05/15/22 Next Office Visit and Type: 08/20/22 Last Controlled Substance Agreement Date: 02-12-22 Last UDS:02-12-22

## 2022-06-30 NOTE — Telephone Encounter (Signed)
Patient called requesting refill on oxycodone to be sent in to [facility]

## 2022-07-01 MED ORDER — OXYCODONE-ACETAMINOPHEN 5-325 MG PO TABS: 1.0000 | ORAL_TABLET | Freq: Three times a day (TID) | ORAL | 0 refills | Status: DC | PRN

## 2022-07-07 ENCOUNTER — Encounter: Payer: Self-pay | Admitting: Internal Medicine

## 2022-07-07 ENCOUNTER — Ambulatory Visit (INDEPENDENT_AMBULATORY_CARE_PROVIDER_SITE_OTHER): Payer: Medicare Other | Admitting: Internal Medicine

## 2022-07-07 VITALS — BP 152/66 | HR 67 | Ht 68.5 in | Wt 158.0 lb

## 2022-07-07 DIAGNOSIS — K648 Other hemorrhoids: Secondary | ICD-10-CM

## 2022-07-07 NOTE — Progress Notes (Signed)
Jeffrey Mcbride is a 67 year old male with a history of symptomatic internal hemorrhoids who presents for banding  Symptomatic hemorrhoids for years including bleeding with most bowel movements, prolapse and itching.  Up-to-date with colonoscopy with last exam 12/28/2019   PROCEDURE NOTE:  The patient presents with symptomatic grade 3 internal hemorrhoids, requesting rubber band ligation of his hemorrhoidal disease.  All risks, benefits and alternative forms of therapy were described and informed consent was obtained.   The anorectum was pre-medicated with 0.125% nitroglycerin ointment The decision was made to band the LL internal hemorrhoid, and the [facility] O'Regan System was used to perform band ligation without complication.   Digital anorectal examination was then performed to assure proper positioning of the band, and to adjust the banded tissue as required.  The patient was discharged home without pain or other issues.  Dietary and behavioral recommendations were given and along with follow-up instructions.    The patient will return as scheduled for follow-up and possible additional banding as required. No complications were encountered and the patient tolerated the procedure well.

## 2022-07-07 NOTE — Patient Instructions (Addendum)
We have scheduled you for your next hem bandings as follows: #2- May 17th at 11:30am  #3 July 9th at 3:40pm  HEMORRHOID BANDING PROCEDURE    FOLLOW-UP CARE   The procedure you have had should have been relatively painless since the banding of the area involved does not have nerve endings and there is no pain sensation.  The rubber band cuts off the blood supply to the hemorrhoid and the band may fall off as soon as 48 hours after the banding (the band may occasionally be seen in the toilet bowl following a bowel movement). You may notice a temporary feeling of fullness in the rectum which should respond adequately to plain Tylenol or Motrin.  Following the banding, avoid strenuous exercise that evening and resume full activity the next day.  A sitz bath (soaking in a warm tub) or bidet is soothing, and can be useful for cleansing the area after bowel movements.     To avoid constipation, take two tablespoons of natural wheat bran, natural oat bran, flax, Benefiber or any over the counter fiber supplement and increase your water intake to 7-8 glasses daily.    Unless you have been prescribed anorectal medication, do not put anything inside your rectum for two weeks: No suppositories, enemas, fingers, etc.  Occasionally, you may have more bleeding than usual after the banding procedure.  This is often from the untreated hemorrhoids rather than the treated one.  Don't be concerned if there is a tablespoon or so of blood.  If there is more blood than this, lie flat with your bottom higher than your head and apply an ice pack to the area. If the bleeding does not stop within a half an hour or if you feel faint, call our office at [phone] or go to the emergency room.  Problems are not common; however, if there is a substantial amount of bleeding, severe pain, chills, fever or difficulty passing urine (very rare) or other problems, you should call us at [phone] or report to the nearest  emergency room.  Do not stay seated continuously for more than 2-3 hours for a day or two after the procedure.  Tighten your buttock muscles 10-15 times every two hours and take 10-15 deep breaths every 1-2 hours.  Do not spend more than a few minutes on the toilet if you cannot empty your bowel; instead re-visit the toilet at a later time.     _______________________________________________________  If your blood pressure at your visit was 140/90 or greater, please contact your primary care physician to follow up on this.  _______________________________________________________  If you are age 45 or older, your body mass index should be between 23-30. Your Body mass index is 23.67 kg/m. If this is out of the aforementioned range listed, please consider follow up with your Primary Care Provider.  If you are age 72 or younger, your body mass index should be between 19-25. Your Body mass index is 23.67 kg/m. If this is out of the aformentioned range listed, please consider follow up with your Primary Care Provider.   ________________________________________________________  The [facility] providers would like to encourage you to use [facility] to communicate with providers for non-urgent requests or questions.  Due to long hold times on the telephone, sending your provider a message by [facility] may be a faster and more efficient way to get a response.  Please allow 48 business hours for a response.  Please remember that this is for  non-urgent requests.  _______________________________________________________ It was a pleasure to see you today!  Thank you for trusting me with your gastrointestinal care!

## 2022-08-14 ENCOUNTER — Ambulatory Visit: Payer: Medicare Other | Admitting: Internal Medicine

## 2022-08-14 ENCOUNTER — Telehealth: Payer: Self-pay | Admitting: Internal Medicine

## 2022-08-14 MED ORDER — OXYCODONE-ACETAMINOPHEN 5-325 MG PO TABS: 1.0000 | ORAL_TABLET | Freq: Three times a day (TID) | ORAL | 0 refills | Status: DC | PRN

## 2022-08-14 NOTE — Telephone Encounter (Signed)
Prescription Request  08/14/2022  LOV: 05/21/2022  What is the name of the medication or equipment? oxyCODONE-acetaminophen (ROXICET) 5-325 MG tablet    Have you contacted your pharmacy to request a refill? No   Which pharmacy would you like this sent to?  [name], [place] - [address] [place] [address] Phone: [phone] Fax: [phone]    Patient notified that their request is being sent to the clinical staff for review and that they should receive a response within 2 business days.   Please advise at Mobile [phone] (mobile)

## 2022-08-14 NOTE — Telephone Encounter (Signed)
Refill sent.

## 2022-08-19 ENCOUNTER — Telehealth: Payer: Self-pay | Admitting: Internal Medicine

## 2022-08-19 ENCOUNTER — Other Ambulatory Visit (INDEPENDENT_AMBULATORY_CARE_PROVIDER_SITE_OTHER): Payer: Medicare Other

## 2022-08-19 DIAGNOSIS — K625 Hemorrhage of anus and rectum: Secondary | ICD-10-CM

## 2022-08-19 LAB — CBC WITH DIFFERENTIAL/PLATELET
Basophils Absolute: 0.1 10*3/uL (ref 0.0–0.1)
Basophils Relative: 0.8 % (ref 0.0–3.0)
Eosinophils Absolute: 0.6 10*3/uL (ref 0.0–0.7)
Eosinophils Relative: 7.8 % — ABNORMAL HIGH (ref 0.0–5.0)
HCT: 39.9 % (ref 39.0–52.0)
Hemoglobin: 13.2 g/dL (ref 13.0–17.0)
Lymphocytes Relative: 24.9 % (ref 12.0–46.0)
Lymphs Abs: 2 10*3/uL (ref 0.7–4.0)
MCHC: 33.2 g/dL (ref 30.0–36.0)
MCV: 95.2 fl (ref 78.0–100.0)
Monocytes Absolute: 0.5 10*3/uL (ref 0.1–1.0)
Monocytes Relative: 5.7 % (ref 3.0–12.0)
Neutro Abs: 5 10*3/uL (ref 1.4–7.7)
Neutrophils Relative %: 60.8 % (ref 43.0–77.0)
Platelets: 317 10*3/uL (ref 150.0–400.0)
RBC: 4.19 Mil/uL — ABNORMAL LOW (ref 4.22–5.81)
RDW: 15.3 % (ref 11.5–15.5)
WBC: 8.1 10*3/uL (ref 4.0–10.5)

## 2022-08-19 NOTE — Telephone Encounter (Signed)
Pt states he had a BM with blood on it. He continued to see BRB and states it colored the toilet bowl. Pt will come in for a cbc. Order in epic.

## 2022-08-19 NOTE — Telephone Encounter (Signed)
Jeffrey Mcbride Can you call and check on him Likely needs CBC Known hemorrhoids, banding > 1 month ago x 1

## 2022-08-19 NOTE — Telephone Encounter (Signed)
Left voicemail for pt to call back.

## 2022-08-19 NOTE — Telephone Encounter (Signed)
Patient called stated he is having a lot of rectal bleeding this morning and would like to speak with a nurse.

## 2022-08-20 ENCOUNTER — Encounter: Payer: Self-pay | Admitting: Internal Medicine

## 2022-08-20 ENCOUNTER — Telehealth: Payer: Self-pay

## 2022-08-20 ENCOUNTER — Ambulatory Visit: Payer: Medicare Other | Admitting: Internal Medicine

## 2022-08-20 ENCOUNTER — Ambulatory Visit (INDEPENDENT_AMBULATORY_CARE_PROVIDER_SITE_OTHER): Payer: Medicare Other | Admitting: Internal Medicine

## 2022-08-20 VITALS — BP 130/76 | HR 57 | Ht 69.0 in | Wt 152.0 lb

## 2022-08-20 DIAGNOSIS — K648 Other hemorrhoids: Secondary | ICD-10-CM

## 2022-08-20 DIAGNOSIS — K642 Third degree hemorrhoids: Secondary | ICD-10-CM

## 2022-08-20 DIAGNOSIS — K625 Hemorrhage of anus and rectum: Secondary | ICD-10-CM

## 2022-08-20 NOTE — Telephone Encounter (Signed)
Patient called confirming his appointment for today 5/23 at 11:40.

## 2022-08-20 NOTE — Patient Instructions (Addendum)
You are scheduled for your 3rd hemorrhoidal banding on 10/06/22 at 3:40 pm.  HEMORRHOID BANDING PROCEDURE    FOLLOW-UP CARE   The procedure you have had should have been relatively painless since the banding of the area involved does not have nerve endings and there is no pain sensation.  The rubber band cuts off the blood supply to the hemorrhoid and the band may fall off as soon as 48 hours after the banding (the band may occasionally be seen in the toilet bowl following a bowel movement). You may notice a temporary feeling of fullness in the rectum which should respond adequately to plain Tylenol or Motrin.  Following the banding, avoid strenuous exercise that evening and resume full activity the next day.  A sitz bath (soaking in a warm tub) or bidet is soothing, and can be useful for cleansing the area after bowel movements.     To avoid constipation, take two tablespoons of natural wheat bran, natural oat bran, flax, Benefiber or any over the counter fiber supplement and increase your water intake to 7-8 glasses daily.    Unless you have been prescribed anorectal medication, do not put anything inside your rectum for two weeks: No suppositories, enemas, fingers, etc.  Occasionally, you may have more bleeding than usual after the banding procedure.  This is often from the untreated hemorrhoids rather than the treated one.  Don't be concerned if there is a tablespoon or so of blood.  If there is more blood than this, lie flat with your bottom higher than your head and apply an ice pack to the area. If the bleeding does not stop within a half an hour or if you feel faint, call our office at [phone] or go to the emergency room.  Problems are not common; however, if there is a substantial amount of bleeding, severe pain, chills, fever or difficulty passing urine (very rare) or other problems, you should call us at [phone] or report to the nearest emergency room.  Do not stay  seated continuously for more than 2-3 hours for a day or two after the procedure.  Tighten your buttock muscles 10-15 times every two hours and take 10-15 deep breaths every 1-2 hours.  Do not spend more than a few minutes on the toilet if you cannot empty your bowel; instead re-visit the toilet at a later time.    _______________________________________________________  If your blood pressure at your visit was 140/90 or greater, please contact your primary care physician to follow up on this.  _______________________________________________________  If you are age 55 or older, your body mass index should be between 23-30. Your Body mass index is 22.45 kg/m. If this is out of the aforementioned range listed, please consider follow up with your Primary Care Provider.  If you are age 21 or younger, your body mass index should be between 19-25. Your Body mass index is 22.45 kg/m. If this is out of the aformentioned range listed, please consider follow up with your Primary Care Provider.   ________________________________________________________  The [facility] providers would like to encourage you to use [facility] to communicate with providers for non-urgent requests or questions.  Due to long hold times on the telephone, sending your provider a message by [facility] may be a faster and more efficient way to get a response.  Please allow 48 business hours for a response.  Please remember that this is for non-urgent requests.  _______________________________________________________  Due to recent changes in  healthcare laws, you may see the results of your imaging and laboratory studies on MyChart before your provider has had a chance to review them.  We understand that in some cases there may be results that are confusing or concerning to you. Not all laboratory results come back in the same time frame and the provider may be waiting for multiple results in order to interpret others.  Please give [place] 48  hours in order for your provider to thoroughly review all the results before contacting the office for clarification of your results.

## 2022-08-20 NOTE — Telephone Encounter (Signed)
Pt calling this am states he has a BM this am and had lots of BR from his rectum. Reports it colored the toilet bowl. He states it took 5 min for the bleeding to stop. He is aware that his labs were good yesterday. Pt has an appt with his PCP today at 3:30pm. Offered pt an appt with an APP but he really wants to see Dr. Rhea Belton. Please advise.

## 2022-08-20 NOTE — Telephone Encounter (Signed)
Patient is returning Linda's call.

## 2022-08-20 NOTE — Telephone Encounter (Signed)
Left message for Jeffrey Mcbride to call back.  Jeffrey Mcbride added on to see Dr. Terri Piedra today at 11:40AM. Left Jeffrey Mcbride a message to call back to confirm the appt.

## 2022-08-20 NOTE — Telephone Encounter (Signed)
I can try to fit him in today at 1145.  He will likely need to wait.  If this is a post banding ulcer bleeding he will need to go to the hospital for therapeutic flexible sigmoidoscopy.  Please make him aware of that.

## 2022-08-20 NOTE — Progress Notes (Signed)
Jeffrey Mcbride is a 67 year old male with a history of bleeding internal hemorrhoids who presents for urgent visit for rectal bleeding  He was here on 07/07/2022 and had hemorrhoidal banding to the left lateral internal hemorrhoid He notes after this he had chills for about 2 hours before bed but no fever.  This was resolved and he felt normal by the next morning. He reported near resolution of bleeding until 2 days ago. For the last 2 mornings he has had significant rectal bleeding lasting about 10 minutes after his morning bowel movement.  He thought he was going to have to go to the ER to get the bleeding to stop. No subsequent bleeding throughout the day. No abdominal pain.  He came recently for CBC     Latest Ref Rng & Units 08/19/2022   11:30 AM 02/12/2022   10:08 AM 12/16/2020    3:19 PM  CBC  WBC 4.0 - 10.5 K/uL 8.1  7.6  11.9   Hemoglobin 13.0 - 17.0 g/dL 40.9  81.1  91.4   Hematocrit 39.0 - 52.0 % 39.9  42.3  39.8   Platelets 150.0 - 400.0 K/uL 317.0  335.0  325.0    Assessment/plan Anoscopy performed Bleeding internal hemorrhoids not apparently from the left lateral location banded now 6 weeks ago We are proceeding with additional hemorrhoidal banding today  ANOSCOPY: Using a disposable, lubricated, slotted, self-illuminating anoscope, the rectum was intubated without difficulty. The trochar was removed and the ano-rectum was circumferentially inspected. There were internal hemorrhoids, R RA and RP. There was no finding of an anorectal fissure. The rectal mucosa was not inflamed. No neoplasia or other pathology was identified. The inspection was well tolerated.    PROCEDURE NOTE:  The patient presents with symptomatic grade 3 internal hemorrhoids, requesting rubber band ligation of his hemorrhoidal disease.  All risks, benefits and alternative forms of therapy were described and informed consent was obtained.   The anorectum was pre-medicated with 0.125% nitroglycerin  ointment The decision was made to band the RA (LL banded on 07/07/2022) internal hemorrhoid, and the [facility] O'Regan System was used to perform band ligation without complication.   Digital anorectal examination was then performed to assure proper positioning of the band, and to adjust the banded tissue as required.  The patient was discharged home without pain or other issues.  Dietary and behavioral recommendations were given and along with follow-up instructions.      The patient will return as scheduled for follow-up and possible additional banding as required. No complications were encountered and the patient tolerated the procedure well.

## 2022-08-27 ENCOUNTER — Encounter: Payer: Self-pay | Admitting: Internal Medicine

## 2022-08-27 ENCOUNTER — Ambulatory Visit (INDEPENDENT_AMBULATORY_CARE_PROVIDER_SITE_OTHER): Payer: Medicare Other | Admitting: Internal Medicine

## 2022-08-27 VITALS — BP 122/70 | HR 63 | Temp 97.7°F | Ht 68.5 in | Wt 153.0 lb

## 2022-08-27 DIAGNOSIS — M5416 Radiculopathy, lumbar region: Secondary | ICD-10-CM | POA: Diagnosis not present

## 2022-08-27 DIAGNOSIS — F1120 Opioid dependence, uncomplicated: Secondary | ICD-10-CM | POA: Diagnosis not present

## 2022-08-27 NOTE — Assessment & Plan Note (Signed)
Recent exacerbation due to heavy lifting at work Again discussed using good judgement---and tell of physician limitations Has the oxycodone and uses regularly

## 2022-08-27 NOTE — Progress Notes (Signed)
Subjective:    Patient ID: Jeffrey Mcbride, male    DOB: July 18, 1955, 67 y.o.   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     methocarbamol (ROBAXIN) 500 MG tablet Take 1 tablet (500 mg total) by mouth 2 (two) times daily as needed for muscle spasms. 60 tablet 1   oxyCODONE-acetaminophen (ROXICET) 5-325 MG tablet Take 1 tablet by mouth 3 (three) times daily as needed for severe pain. 75 tablet 0   No current facility-administered medications on file prior to visit.    Allergies  Allergen Reactions   Motrin [Ibuprofen] Other (See Comments)    High doses gives pt gas   Naprosyn [Naproxen] Other (See Comments)    Elevated blood pressure    Past Medical History:  Diagnosis Date   Allergic rhinitis due to pollen    Back pain    Diverticulosis    External hemorrhoids    Hypertension    Insomnia    Internal hemorrhoids    Lumbar back pain with radiculopathy affecting left lower extremity    Tubular adenoma of colon     Past Surgical History:  Procedure Laterality Date   BACK SURGERY     LUMBAR DISC SURGERY  2004   LUMBAR FUSION     Dr Yetta Barre   VASECTOMY      Family History  Problem Relation Age of Onset   Heart disease Mother    Hyperlipidemia Mother    Heart disease Father    Hyperlipidemia Father    Alcohol abuse  Sister    Breast cancer Daughter    Esophageal cancer Neg Hx    Colon cancer Neg Hx    Rectal cancer Neg Hx    Stomach cancer Neg Hx     Social History   Socioeconomic History   Marital status: Married    Spouse name: Not on file   Number of children: 3   Years of education: Not on file   Highest education level: Not on file  Occupational History   Occupation: HVAC--disabled   Occupation: Solicitor  Tobacco Use   Smoking status: Never    Passive exposure: Never   Smokeless tobacco: Never  Vaping Use   Vaping Use: Never used  Substance and Sexual Activity   Alcohol use: Yes    Alcohol/week: 0.0 standard drinks of alcohol    Comment: "very little"   Drug use: No   Sexual activity: Not on file  Other Topics Concern   Not on file  Social History Narrative   3 daughters   Disabled due to back problems   Caregiver for wife-- and helps with multiple grandchildren      No living will   Would want wife to make decisions---alternate daughter Meriam Sprague   Would accept resuscitation attempts  Not sure about tube feeds--doesn't want if cognitively unaware   Wants to donate any organs possible   Social Determinants of Health   Financial Resource Strain: Not on file  Food Insecurity: Not on file  Transportation Needs: Not on file  Physical Activity: Not on file  Stress: Not on file  Social Connections: Not on file  Intimate Partner Violence: Not on file   Review of Systems Sleep is variable--some trouble at times      Objective:   Physical Exam Constitutional:      Appearance: Normal appearance.  Neurological:     Mental Status: He is alert.  Psychiatric:        Mood and Affect: Mood normal.        Behavior: Behavior normal.            Assessment & Plan:

## 2022-08-27 NOTE — Assessment & Plan Note (Signed)
PDMP reviewed No concerns 

## 2022-09-01 NOTE — Telephone Encounter (Signed)
Patient called wanted to let the doctor know that even after the banding last week he is still bleeding.

## 2022-09-01 NOTE — Telephone Encounter (Signed)
Ok, keep next appt Likely persistent hemorrhoidal bleeding

## 2022-09-01 NOTE — Telephone Encounter (Signed)
Detailed message left for pt on voicemail.

## 2022-09-23 ENCOUNTER — Other Ambulatory Visit: Payer: Self-pay | Admitting: Internal Medicine

## 2022-09-23 MED ORDER — OXYCODONE-ACETAMINOPHEN 5-325 MG PO TABS: 1.0000 | ORAL_TABLET | Freq: Three times a day (TID) | ORAL | 0 refills | Status: DC | PRN

## 2022-09-23 NOTE — Telephone Encounter (Signed)
Name of Medication: Oxycodone Name of Pharmacy: [facility] Last Fill or Written Date and Quantity: 08/14/22 #75 Last Office Visit and Type: 08/27/22 Next Office Visit and Type: 11/27/22 Last Controlled Substance Agreement Date: 02-12-22 Last UDS:02-12-22

## 2022-09-23 NOTE — Telephone Encounter (Signed)
Prescription Request  09/23/2022  LOV: 08/27/2022  What is the name of the medication or equipment? oxyCODONE-acetaminophen (ROXICET) 5-325 MG tablet    Have you contacted your pharmacy to request a refill? No   Which pharmacy would you like this sent to?  [id number] [name], [place] - [address] [place] [address] Phone: [phone] Fax: [phone]    Patient notified that their request is being sent to the clinical staff for review and that they should receive a response within 2 business days.   Please advise at Mobile [phone] (mobile)

## 2022-10-06 ENCOUNTER — Ambulatory Visit: Payer: Medicare Other | Admitting: Internal Medicine

## 2022-10-06 ENCOUNTER — Encounter: Payer: Self-pay | Admitting: Internal Medicine

## 2022-10-06 VITALS — BP 130/78 | HR 61 | Ht 68.5 in | Wt 149.5 lb

## 2022-10-06 DIAGNOSIS — K648 Other hemorrhoids: Secondary | ICD-10-CM | POA: Diagnosis not present

## 2022-10-06 NOTE — Patient Instructions (Signed)
HEMORRHOID BANDING PROCEDURE    FOLLOW-UP CARE   The procedure you have had should have been relatively painless since the banding of the area involved does not have nerve endings and there is no pain sensation.  The rubber band cuts off the blood supply to the hemorrhoid and the band may fall off as soon as 48 hours after the banding (the band may occasionally be seen in the toilet bowl following a bowel movement). You may notice a temporary feeling of fullness in the rectum which should respond adequately to plain Tylenol or Motrin.  Following the banding, avoid strenuous exercise that evening and resume full activity the next day.  A sitz bath (soaking in a warm tub) or bidet is soothing, and can be useful for cleansing the area after bowel movements.     To avoid constipation, take two tablespoons of natural wheat bran, natural oat bran, flax, Benefiber or any over the counter fiber supplement and increase your water intake to 7-8 glasses daily.    Unless you have been prescribed anorectal medication, do not put anything inside your rectum for two weeks: No suppositories, enemas, fingers, etc.  Occasionally, you may have more bleeding than usual after the banding procedure.  This is often from the untreated hemorrhoids rather than the treated one.  Don't be concerned if there is a tablespoon or so of blood.  If there is more blood than this, lie flat with your bottom higher than your head and apply an ice pack to the area. If the bleeding does not stop within a half an hour or if you feel faint, call our office at [phone] or go to the emergency room.  Problems are not common; however, if there is a substantial amount of bleeding, severe pain, chills, fever or difficulty passing urine (very rare) or other problems, you should call us at [phone] or report to the nearest emergency room.  Do not stay seated continuously for more than 2-3 hours for a day or two after the procedure.   Tighten your buttock muscles 10-15 times every two hours and take 10-15 deep breaths every 1-2 hours.  Do not spend more than a few minutes on the toilet if you cannot empty your bowel; instead re-visit the toilet at a later time.   _______________________________________________________  If your blood pressure at your visit was 140/90 or greater, please contact your primary care physician to follow up on this.  _______________________________________________________  If you are age 63 or older, your body mass index should be between 23-30. Your Body mass index is 22.4 kg/m. If this is out of the aforementioned range listed, please consider follow up with your Primary Care Provider.  If you are age 57 or younger, your body mass index should be between 19-25. Your Body mass index is 22.4 kg/m. If this is out of the aformentioned range listed, please consider follow up with your Primary Care Provider.   ________________________________________________________  The [facility] providers would like to encourage you to use [facility] to communicate with providers for non-urgent requests or questions.  Due to long hold times on the telephone, sending your provider a message by [facility] may be a faster and more efficient way to get a response.  Please allow 48 business hours for a response.  Please remember that this is for non-urgent requests.  _______________________________________________________

## 2022-10-06 NOTE — Progress Notes (Signed)
Jeffrey Mcbride is a 67 year old male with a history of bleeding internal hemorrhoids who presents for additional hemorrhoidal banding  He had hemorrhoid banding on 07/07/2022 and 08/20/2022 He had chills without fever after the April banding but not after the last. 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   PROCEDURE NOTE:  The patient presents with symptomatic grade 3 internal hemorrhoids, requesting rubber band ligation of his hemorrhoidal disease.  All risks, benefits and alternative forms of therapy were described and informed consent was obtained.   The anorectum was pre-medicated with 0.125% nitroglycerin ointment The decision was made to band the RP (LL and RA previously banded) internal hemorrhoid, and the [facility] was used to perform band ligation without complication.   Digital anorectal examination was then performed to assure proper positioning of the band, and to adjust the banded tissue as required.  The patient was discharged home without pain or other issues.  Dietary and behavioral recommendations were given and along with follow-up instructions.    The patient will return as needed for follow-up and possible additional banding as required. No complications were encountered and the patient tolerated the procedure well.

## 2022-10-19 ENCOUNTER — Telehealth: Payer: Self-pay | Admitting: Internal Medicine

## 2022-10-19 NOTE — Telephone Encounter (Signed)
Inbound call from patient stating he had banding done on 7/9. States over the weekend he started experiencing blood in his stools and yesterday 7/21 it was more. Also states this morning 7/22 he woke up with a lot of bleeding. Requesting a call back to discuss further. Please advise, thank you.

## 2022-10-19 NOTE — Telephone Encounter (Signed)
Called patient to inform him due to his increased amount of bleeding noted in his stool on 10/18/22 and on 10/19/22, to go to the ED. No answer upon calling to notify patient, noted DPR, stating may leave detailed message.Detailed message left. Also left message stating if have any questions to call back.

## 2022-10-20 ENCOUNTER — Ambulatory Visit: Payer: Medicare Other | Admitting: Internal Medicine

## 2022-10-20 ENCOUNTER — Encounter: Payer: Self-pay | Admitting: Internal Medicine

## 2022-10-20 VITALS — BP 120/66 | HR 63 | Temp 98.0°F | Ht 68.5 in | Wt 148.0 lb

## 2022-10-20 DIAGNOSIS — K625 Hemorrhage of anus and rectum: Secondary | ICD-10-CM | POA: Diagnosis not present

## 2022-10-20 LAB — CBC
HCT: 37.4 % — ABNORMAL LOW (ref 39.0–52.0)
Hemoglobin: 12.3 g/dL — ABNORMAL LOW (ref 13.0–17.0)
MCHC: 33 g/dL (ref 30.0–36.0)
MCV: 95.5 fl (ref 78.0–100.0)
Platelets: 361 10*3/uL (ref 150.0–400.0)
RBC: 3.92 Mil/uL — ABNORMAL LOW (ref 4.22–5.81)
RDW: 15.3 % (ref 11.5–15.5)
WBC: 8 10*3/uL (ref 4.0–10.5)

## 2022-10-20 NOTE — Progress Notes (Signed)
Do I have flex sig capability in [facility] soon Recent hemorrhoid banding with intermittent rectal bleeding still

## 2022-10-20 NOTE — Progress Notes (Signed)
Subjective:    Patient ID: Jeffrey Mcbride, male    DOB: Feb 07, 1956, 67 y.o.   MRN: [id number]  HPI Here due to rectal bleeding Had hemorrhoid banding done 7/9--this was the third one  Took a new job at the [employer] Did some steps, etc--but not really overdone it  Worked fairly hard 4 days ago Some spotting Then yesterday--awoke in a bed full of blood--took the day off Some stomach cramps Lots of blood in the stool today also--then next one had none  Current Outpatient Medications on File Prior to Visit  Medication Sig Dispense Refill   ibuprofen (ADVIL) 200 MG tablet Take 200 mg by mouth every 6 (six) hours as needed.     methocarbamol (ROBAXIN) 500 MG tablet Take 1 tablet (500 mg total) by mouth 2 (two) times daily as needed for muscle spasms. 60 tablet 1   oxyCODONE-acetaminophen (ROXICET) 5-325 MG tablet Take 1 tablet by mouth 3 (three) times daily as needed for severe pain. 75 tablet 0   No current facility-administered medications on file prior to visit.    Allergies  Allergen Reactions   Latex    Motrin [Ibuprofen] Other (See Comments)    High doses gives pt gas   Naprosyn [Naproxen] Other (See Comments)    Elevated blood pressure    Past Medical History:  Diagnosis Date   Allergic rhinitis due to pollen    Back pain    Diverticulosis    External hemorrhoids    Hypertension    Insomnia    Internal hemorrhoids    Lumbar back pain with radiculopathy affecting left lower extremity    Tubular adenoma of colon     Past Surgical History:  Procedure Laterality Date   BACK SURGERY     LUMBAR DISC SURGERY  2004   LUMBAR FUSION     Dr Yetta Barre   VASECTOMY      Family History  Problem Relation Age of Onset   Heart disease Mother    Hyperlipidemia Mother    Heart disease Father    Hyperlipidemia Father    Alcohol abuse Sister    Breast cancer Daughter    Esophageal cancer Neg Hx    Colon cancer Neg Hx    Rectal cancer Neg Hx     Stomach cancer Neg Hx     Social History   Socioeconomic History   Marital status: Married    Spouse name: Not on file   Number of children: 3   Years of education: Not on file   Highest education level: Not on file  Occupational History   Occupation: HVAC--disabled   Occupation: Solicitor  Tobacco Use   Smoking status: Never    Passive exposure: Never   Smokeless tobacco: Never  Vaping Use   Vaping status: Never Used  Substance and Sexual Activity   Alcohol use: Yes    Alcohol/week: 0.0 standard drinks of alcohol    Comment: "very little"   Drug use: No   Sexual activity: Not on file  Other Topics Concern   Not on file  Social History Narrative   3 daughters   Disabled due to back problems   Caregiver for wife-- and helps with multiple grandchildren      No living will   Would want wife to make decisions---alternate daughter Meriam Sprague   Would accept resuscitation attempts   Not sure about tube feeds--doesn't want if cognitively unaware   Wants to donate any organs possible  Social Determinants of Health   Financial Resource Strain: Not on file  Food Insecurity: Not on file  Transportation Needs: Not on file  Physical Activity: Not on file  Stress: Not on file  Social Connections: Not on file  Intimate Partner Violence: Not on file   Review of Systems No dizziness or syncope Also some swelling by lateral malleolus in left ankle    Objective:   Physical Exam Genitourinary:    Comments: No external hemorrhoids No rectal mass or tenderness Musculoskeletal:     Comments: Mild puffiness at lateral malleolus left ankle. ROM fair--no tenderness. (Discussed using lace up brace prn)            Assessment & Plan:

## 2022-10-20 NOTE — Assessment & Plan Note (Signed)
Rectal exam not consistent with recurrence of hemorrhoids Could likely be diverticular bleed---if so, he may be done with this Will check CBC Advised him to go to ER if this recurs

## 2022-10-21 ENCOUNTER — Telehealth: Payer: Self-pay | Admitting: *Deleted

## 2022-10-21 DIAGNOSIS — K625 Hemorrhage of anus and rectum: Secondary | ICD-10-CM

## 2022-10-21 NOTE — Telephone Encounter (Addendum)
-----   Message from Carie Caddy Pyrtle sent at 10/21/2022  9:09 AM EDT ----- Yes ----- Message ----- From: Richardson Chiquito, RN Sent: 10/20/2022   3:21 PM EDT To: Beverley Fiedler, MD  Yes, you have 3 or 4 availabilities through 11/02/22. Would you like me to schedule him direct? ----- Message ----- From: Beverley Fiedler, MD Sent: 10/20/2022   2:49 PM EDT To: Richardson Chiquito, RN  Pyrtle, Carie Caddy, MD at 10/20/2022 12:00 PM  Status: Signed  Do I have flex sig capability in [facility] soon Recent hemorrhoid banding with intermittent rectal bleeding still      ----- Message ----- From: Karie Schwalbe, MD Sent: 10/20/2022  12:40 PM EDT To: Beverley Fiedler, MD  FYI

## 2022-10-21 NOTE — Telephone Encounter (Signed)
Patient has been scheduled for flexible sigmoidoscopy on 10/28/22 at 1:30 pm in [facility]. I have left a message for patient to call back.

## 2022-10-22 ENCOUNTER — Encounter: Payer: Self-pay | Admitting: Internal Medicine

## 2022-10-22 NOTE — Telephone Encounter (Signed)
I have spoken to patient who is agreeable to flexible sigmoidoscopy for further evaluation of intermittent rectal bleeding. I have created prep instructions and have placed them at our front desk for pick up/questions as patient states he is not very good working with [employer].

## 2022-10-22 NOTE — Telephone Encounter (Signed)
Left a message for patient to call back. I attempted to reach patient on his home number as well but phone rings then goes to busy signal.

## 2022-10-28 ENCOUNTER — Encounter: Payer: Self-pay | Admitting: Internal Medicine

## 2022-10-28 ENCOUNTER — Ambulatory Visit (AMBULATORY_SURGERY_CENTER): Payer: Medicare Other | Admitting: Internal Medicine

## 2022-10-28 ENCOUNTER — Encounter (INDEPENDENT_AMBULATORY_CARE_PROVIDER_SITE_OTHER): Payer: Self-pay

## 2022-10-28 VITALS — BP 115/58 | HR 43 | Temp 97.5°F | Resp 11 | Ht 69.0 in | Wt 152.0 lb

## 2022-10-28 DIAGNOSIS — K625 Hemorrhage of anus and rectum: Secondary | ICD-10-CM

## 2022-10-28 DIAGNOSIS — K648 Other hemorrhoids: Secondary | ICD-10-CM

## 2022-10-28 MED ORDER — SODIUM CHLORIDE 0.9 % IV SOLN: 500.0000 mL | Freq: Once | INTRAVENOUS | Status: DC

## 2022-10-28 NOTE — Progress Notes (Signed)
GASTROENTEROLOGY PROCEDURE H&P NOTE   Primary Care Physician: Karie Schwalbe, MD    Reason for Procedure:  Flexible sigmoidoscopy for recent bleeding  Plan:    Flexible sigmoidoscopy  Patient is appropriate for endoscopic procedure(s) in the ambulatory ([facility]) setting.  The nature of the procedure, as well as the risks, benefits, and alternatives were carefully and thoroughly reviewed with the patient. Ample time for discussion and questions allowed. The patient understood, was satisfied, and agreed to proceed.     HPI: Jeffrey Mcbride is a 67 y.o. male who presents for flexible sigmoidoscopy.  Medical history as below.  Tolerated the prep.  No recent chest pain or shortness of breath.  No abdominal pain today.  Past Medical History:  Diagnosis Date   Allergic rhinitis due to pollen    Back pain    Diverticulosis    External hemorrhoids    Hypertension    Insomnia    Internal hemorrhoids    Lumbar back pain with radiculopathy affecting left lower extremity    Tubular adenoma of colon     Past Surgical History:  Procedure Laterality Date   BACK SURGERY     LUMBAR DISC SURGERY  2004   LUMBAR FUSION     Dr Yetta Barre   VASECTOMY      Prior to Admission medications   Medication Sig Start Date End Date Taking? Authorizing Provider  oxyCODONE-acetaminophen (ROXICET) 5-325 MG tablet Take 1 tablet by mouth 3 (three) times daily as needed for severe pain. 09/23/22  Yes Karie Schwalbe, MD  ibuprofen (ADVIL) 200 MG tablet Take 200 mg by mouth every 6 (six) hours as needed.    [provider]  methocarbamol (ROBAXIN) 500 MG tablet Take 1 tablet (500 mg total) by mouth 2 (two) times daily as needed for muscle spasms. 09/10/21   Karie Schwalbe, MD    Current Outpatient Medications  Medication Sig Dispense Refill   oxyCODONE-acetaminophen (ROXICET) 5-325 MG tablet Take 1 tablet by mouth 3 (three) times daily as needed for severe pain. 75 tablet 0   ibuprofen  (ADVIL) 200 MG tablet Take 200 mg by mouth every 6 (six) hours as needed.     methocarbamol (ROBAXIN) 500 MG tablet Take 1 tablet (500 mg total) by mouth 2 (two) times daily as needed for muscle spasms. 60 tablet 1   Current Facility-Administered Medications  Medication Dose Route Frequency Provider Last Rate Last Admin   0.9 %  sodium chloride infusion  500 mL Intravenous Once Azyria Osmon, Carie Caddy, MD        Allergies as of 10/28/2022 - Review Complete 10/28/2022  Allergen Reaction Noted   Latex  10/06/2022   Motrin [ibuprofen] Other (See Comments) 10/13/2011   Naprosyn [naproxen] Other (See Comments) 09/29/2016    Family History  Problem Relation Age of Onset   Heart disease Mother    Hyperlipidemia Mother    Heart disease Father    Hyperlipidemia Father    Alcohol abuse Sister    Breast cancer Daughter    Esophageal cancer Neg Hx    Colon cancer Neg Hx    Rectal cancer Neg Hx    Stomach cancer Neg Hx     Social History   Socioeconomic History   Marital status: Married    Spouse name: Not on file   Number of children: 3   Years of education: Not on file   Highest education level: Not on file  Occupational History   Occupation: HVAC--disabled  Occupation: Solicitor  Tobacco Use   Smoking status: Never    Passive exposure: Never   Smokeless tobacco: Never  Vaping Use   Vaping status: Never Used  Substance and Sexual Activity   Alcohol use: Yes    Alcohol/week: 0.0 standard drinks of alcohol    Comment: "very little"   Drug use: No   Sexual activity: Not on file  Other Topics Concern   Not on file  Social History Narrative   3 daughters   Disabled due to back problems   Caregiver for wife-- and helps with multiple grandchildren      No living will   Would want wife to make decisions---alternate daughter Meriam Sprague   Would accept resuscitation attempts   Not sure about tube feeds--doesn't want if cognitively unaware   Wants to donate any organs  possible   Social Determinants of Health   Financial Resource Strain: Not on file  Food Insecurity: Not on file  Transportation Needs: Not on file  Physical Activity: Not on file  Stress: Not on file  Social Connections: Not on file  Intimate Partner Violence: Not on file    Physical Exam: Vital signs in last 24 hours: @BP  (!) 149/66   Pulse (!) 54   Temp (!) 97.5 F (36.4 C) (Skin)   Ht 5\' 9"  (1.753 m)   Wt 152 lb (68.9 kg)   SpO2 99%   BMI 22.45 kg/m  GEN: NAD EYE: Sclerae anicteric ENT: MMM CV: Non-tachycardic Pulm: CTA b/l GI: Soft, NT/ND NEURO:  Alert & Oriented x 3   Erick Blinks, MD [facility]  10/28/2022 1:36 PM

## 2022-10-28 NOTE — Progress Notes (Signed)
Report to PACU, RN, vss, BBS= Clear.  

## 2022-10-28 NOTE — Progress Notes (Signed)
VS by MO    Pt's states no medical or surgical changes since previsit or office visit.

## 2022-10-28 NOTE — Op Note (Signed)
[facility] Patient Name: Jeffrey Mcbride Procedure Date: 10/28/2022 1:31 PM MRN: [id number] Endoscopist: Beverley Fiedler , MD, [id number] Age: 67 Referring MD:  Date of Birth: [birth year] Gender: Male Account #: [id number] Procedure:                Flexible Sigmoidoscopy Indications:              Rectal bleeding despite hemorrhoidal banding x 3 Medicines:                Monitored Anesthesia Care Procedure:                Pre-Anesthesia Assessment:                           - Prior to the procedure, a History and Physical                            was performed, and patient medications and                            allergies were reviewed. The patient's tolerance of                            previous anesthesia was also reviewed. The risks                            and benefits of the procedure and the sedation                            options and risks were discussed with the patient.                            All questions were answered, and informed consent                            was obtained. Prior Anticoagulants: The patient has                            taken no anticoagulant or antiplatelet agents. ASA                            Grade Assessment: II - A patient with mild systemic                            disease. After reviewing the risks and benefits,                            the patient was deemed in satisfactory condition to                            undergo the procedure.                           After obtaining informed consent, the scope was  passed under direct vision. The [id number]-[id number] Slim SN                            [id number] was introduced through the anus and                            advanced to the sigmoid colon. The flexible                            sigmoidoscopy was accomplished without difficulty.                            The patient tolerated the procedure well. The                            quality of the  bowel preparation was good. Scope In: 1:39:24 PM Scope Out: 1:42:32 PM Total Procedure Duration: 0 hours 3 minutes 8 seconds  Findings:                 Hemorrhoids were found on perianal exam.                           Multiple small-mouthed diverticula were found in                            the sigmoid colon.                           A single ulcer were found in the distal left                            lateral rectum. No bleeding was present. Additional                            banding scars present without ulcer.                           Internal hemorrhoids were found during                            retroflexion, during perianal exam and during                            digital exam. The hemorrhoids were Grade IV                            (internal hemorrhoids that prolapse and cannot be                            reduced manually). Complications:            No immediate complications. Estimated Blood Loss:     Estimated blood loss: none. Impression:               - Diverticulosis in the sigmoid colon.                           -  A left lateral ulcer in the distal rectum with                            post-banding scar in right anterior and posterior                            rectum.                           - Large, Grade IV internal hemorrhoids. This is the                            source for intermittent rectal bleeding and                            prolapse symptom.                           - No specimens collected. Recommendation:           - Patient has a contact number available for                            emergencies. The signs and symptoms of potential                            delayed complications were discussed with the                            patient. Return to normal activities tomorrow.                            Written discharge instructions were provided to the                            patient.                           - Advance diet as  tolerated.                           - Referral to Dr. Delice Bison for consideration of                            surgery for medical/banding refractory symptomatic                            internal hemorrhoids. Beverley Fiedler, MD 10/28/2022 1:48:46 PM This report has been signed electronically.

## 2022-10-28 NOTE — Patient Instructions (Signed)
Please read handouts provided. Advance diet as tolerated. Referral to Dr. Delice Bison for hemorrhoid surgery.  YOU HAD AN ENDOSCOPIC PROCEDURE TODAY AT THE [facility]:   Refer to the procedure report that was given to you for any specific questions about what was found during the examination.  If the procedure report does not answer your questions, please call your gastroenterologist to clarify.  If you requested that your care partner not be given the details of your procedure findings, then the procedure report has been included in a sealed envelope for you to review at your convenience later.  YOU SHOULD EXPECT: Some feelings of bloating in the abdomen. Passage of more gas than usual.  Walking can help get rid of the air that was put into your GI tract during the procedure and reduce the bloating. If you had a lower endoscopy (such as a colonoscopy or flexible sigmoidoscopy) you may notice spotting of blood in your stool or on the toilet paper. If you underwent a bowel prep for your procedure, you may not have a normal bowel movement for a few days.  Please Note:  You might notice some irritation and congestion in your nose or some drainage.  This is from the oxygen used during your procedure.  There is no need for concern and it should clear up in a day or so.  SYMPTOMS TO REPORT IMMEDIATELY:  Following lower endoscopy (colonoscopy or flexible sigmoidoscopy):  Excessive amounts of blood in the stool  Significant tenderness or worsening of abdominal pains  Swelling of the abdomen that is new, acute  Fever of 100F or higher  For urgent or emergent issues, a gastroenterologist can be reached at any hour by calling [phone]. Do not use MyChart messaging for urgent concerns.    DIET:  We do recommend a small meal at first, but then you may proceed to your regular diet.  Drink plenty of fluids but you should avoid alcoholic beverages for 24 hours.  ACTIVITY:  You should  plan to take it easy for the rest of today and you should NOT DRIVE or use heavy machinery until tomorrow (because of the sedation medicines used during the test).    FOLLOW UP: Our staff will call the number listed on your records the next business day following your procedure.  We will call around 7:15- 8:00 am to check on you and address any questions or concerns that you may have regarding the information given to you following your procedure. If we do not reach you, we will leave a message.     If any biopsies were taken you will be contacted by phone or by letter within the next 1-3 weeks.  Please call us at [phone] if you have not heard about the biopsies in 3 weeks.    SIGNATURES/CONFIDENTIALITY: You and/or your care partner have signed paperwork which will be entered into your electronic medical record.  These signatures attest to the fact that that the information above on your After Visit Summary has been reviewed and is understood.  Full responsibility of the confidentiality of this discharge information lies with you and/or your care-partner.

## 2022-10-29 ENCOUNTER — Telehealth: Payer: Self-pay

## 2022-10-29 NOTE — Telephone Encounter (Signed)
Per colonoscopy report on 10/28/22: Place referral to Dr. Roxan Hockey for consideration of surgery for medical/banding refractory symptomatic internal hemorrhoids.  Left message with Hospital doctor (referral coordinator) at [facility] colorectal clinic to call me back regarding referral.

## 2022-10-30 ENCOUNTER — Telehealth: Payer: Self-pay | Admitting: *Deleted

## 2022-10-30 NOTE — Telephone Encounter (Signed)
  Follow up Call-     10/28/2022   12:53 PM  Call back number  Post procedure Call Back phone  # [phone]  Permission to leave phone message Yes   [facility]

## 2022-11-04 NOTE — Telephone Encounter (Signed)
Spoke with Hospital doctor (referral coordinator) to get patient scheduled for a consultation with Dr. Roxan Hockey. Patient is scheduled on 11/17/22 at 10:45 am. Referral has been faxed there office and Amber states she will reach out to patient to get the other information needed for the appt. Also, called patient and left a detailed voicemail informing him of the appt date and that Amber will be contacting him for more information.

## 2022-11-12 ENCOUNTER — Other Ambulatory Visit: Payer: Self-pay | Admitting: Internal Medicine

## 2022-11-12 MED ORDER — OXYCODONE-ACETAMINOPHEN 5-325 MG PO TABS: 1.0000 | ORAL_TABLET | Freq: Three times a day (TID) | ORAL | 0 refills | Status: DC | PRN

## 2022-11-12 NOTE — Telephone Encounter (Signed)
Prescription Request  11/12/2022  LOV: 10/20/2022  What is the name of the medication or equipment? oxyCODONE-acetaminophen (ROXICET) 5-325 MG tablet   Have you contacted your pharmacy to request a refill? Yes   Which pharmacy would you like this sent to?  [name], [place] - [address] [place] [address] Phone: [phone] Fax: [phone]    Patient notified that their request is being sent to the clinical staff for review and that they should receive a response within 2 business days.   Please advise at Mobile [phone] (mobile)

## 2022-11-12 NOTE — Telephone Encounter (Signed)
Name of Medication: Oxycodone Name of [facility]acy: [facility] Last Fill or Written Date and Quantity: 09/23/22 #75 Last Office Visit and Type: 10/20/22 acute Next Office Visit and Type: 11/27/22 Last Controlled Substance Agreement Date: 02-12-22 Last UDS:02-12-22

## 2022-11-27 ENCOUNTER — Ambulatory Visit: Payer: Medicare Other | Admitting: Internal Medicine

## 2022-12-03 ENCOUNTER — Ambulatory Visit: Payer: Medicare Other | Admitting: Internal Medicine

## 2022-12-04 ENCOUNTER — Encounter: Payer: Self-pay | Admitting: Internal Medicine

## 2022-12-10 ENCOUNTER — Encounter: Payer: Self-pay | Admitting: Internal Medicine

## 2022-12-10 ENCOUNTER — Ambulatory Visit (INDEPENDENT_AMBULATORY_CARE_PROVIDER_SITE_OTHER): Payer: Medicare Other | Admitting: Internal Medicine

## 2022-12-10 VITALS — BP 110/60 | HR 54 | Temp 98.0°F | Ht 68.5 in | Wt 151.0 lb

## 2022-12-10 DIAGNOSIS — F1120 Opioid dependence, uncomplicated: Secondary | ICD-10-CM | POA: Diagnosis not present

## 2022-12-10 DIAGNOSIS — M5416 Radiculopathy, lumbar region: Secondary | ICD-10-CM | POA: Diagnosis not present

## 2022-12-10 NOTE — Progress Notes (Signed)
Subjective:    Patient ID: Jeffrey Mcbride, male    DOB: 12/07/1955, 67 y.o.   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     methocarbamol (ROBAXIN) 500 MG tablet Take 1 tablet (500 mg total) by mouth 2 (two) times daily as needed for muscle spasms. 60 tablet 1   oxyCODONE-acetaminophen (ROXICET) 5-325 MG tablet Take 1 tablet by mouth 3 (three) times daily as needed for severe pain. 75 tablet 0   No current facility-administered medications on file prior to visit.    Allergies  Allergen Reactions   Latex    Motrin [Ibuprofen] Other (See Comments)    High doses gives pt gas   Naprosyn [Naproxen] Other (See Comments)    Elevated blood pressure    Past Medical History:  Diagnosis Date   Allergic rhinitis due to pollen    Back pain    Diverticulosis    External hemorrhoids    Hypertension    Insomnia    Internal hemorrhoids    Lumbar back pain with radiculopathy affecting left lower extremity    Tubular adenoma of colon     Past Surgical History:  Procedure Laterality Date   BACK SURGERY     LUMBAR DISC SURGERY  2004   LUMBAR FUSION     Dr  Yetta Barre   VASECTOMY      Family History  Problem Relation Age of Onset   Heart disease Mother    Hyperlipidemia Mother    Heart disease Father    Hyperlipidemia Father    Alcohol abuse Sister    Breast cancer Daughter    Esophageal cancer Neg Hx    Colon cancer Neg Hx    Rectal cancer Neg Hx    Stomach cancer Neg Hx     Social History   Socioeconomic History   Marital status: Married    Spouse name: Not on file   Number of children: 3   Years of education: Not on file   Highest education level: Not on file  Occupational History   Occupation: HVAC--disabled   Occupation: Solicitor  Tobacco Use   Smoking status: Never    Passive exposure: Never   Smokeless tobacco: Never  Vaping Use   Vaping status: Never Used  Substance and Sexual Activity   Alcohol use: Yes    Alcohol/week: 0.0 standard drinks of alcohol    Comment: "very little"   Drug use: No   Sexual activity: Not on file  Other Topics Concern   Not on file  Social History Narrative   3 daughters   Disabled  due to back problems   Caregiver for wife-- and helps with multiple grandchildren      No living will   Would want wife to make decisions---alternate daughter Meriam Sprague   Would accept resuscitation attempts   Not sure about tube feeds--doesn't want if cognitively unaware   Wants to donate any organs possible   Social Determinants of Health   Financial Resource Strain: Not on file  Food Insecurity: Not on file  Transportation Needs: Not on file  Physical Activity: Not on file  Stress: Not on file  Social Connections: Not on file  Intimate Partner Violence: Not on file   Review of Systems Some trouble sleeping with the back pain--takes loratadine at bedtime and it helps Appetite is fair Weight is stable     Objective:   Physical Exam Constitutional:      Appearance: Normal appearance.  Neurological:     Mental Status: He is alert.  Psychiatric:        Mood and Affect:  Mood normal.        Behavior: Behavior normal.            Assessment & Plan:

## 2022-12-10 NOTE — Assessment & Plan Note (Signed)
PDMP reviewed No concerns 

## 2022-12-10 NOTE — Assessment & Plan Note (Signed)
Chronic pain but some better with less physical work/lifting  Does okay with the oxycodone--but trying to limit dosing

## 2022-12-15 ENCOUNTER — Encounter: Payer: Medicare Other | Admitting: Internal Medicine

## 2023-01-05 ENCOUNTER — Telehealth: Payer: Self-pay | Admitting: Internal Medicine

## 2023-01-05 NOTE — Telephone Encounter (Signed)
Prescription Request  01/05/2023     What is the name of the medication or equipment? oxyCODONE-acetaminophen (ROXICET) 5-325 MG tablet    Have you contacted your pharmacy to request a refill? Yes   Which pharmacy would you like this sent to?  [name], [place] - [address] [place] [address] Phone: [phone] Fax: [phone]    Patient notified that their request is being sent to the clinical staff for review and that they should receive a response within 2 business days.   Please advise at Mobile [phone] (mobile)

## 2023-01-06 MED ORDER — OXYCODONE-ACETAMINOPHEN 5-325 MG PO TABS: 1.0000 | ORAL_TABLET | Freq: Three times a day (TID) | ORAL | 0 refills | Status: DC | PRN

## 2023-01-06 NOTE — Telephone Encounter (Signed)
Name of Medication: Oxycodone Name of [facility]: [facility] Last Fill or Written Date and Quantity: 11/12/22 #75 Last Office Visit and Type: 12-10-22 Next Office Visit and Type: 03-11-23 Last Controlled Substance Agreement Date: 02-12-22 Last UDS:02-12-22

## 2023-01-06 NOTE — Telephone Encounter (Signed)
Rx done. 

## 2023-02-19 ENCOUNTER — Other Ambulatory Visit: Payer: Self-pay | Admitting: Internal Medicine

## 2023-02-19 MED ORDER — METHOCARBAMOL 500 MG PO TABS: 500.0000 mg | ORAL_TABLET | Freq: Two times a day (BID) | ORAL | 1 refills | Status: AC | PRN

## 2023-02-19 MED ORDER — OXYCODONE-ACETAMINOPHEN 5-325 MG PO TABS: 1.0000 | ORAL_TABLET | Freq: Three times a day (TID) | ORAL | 0 refills | Status: DC | PRN

## 2023-02-19 NOTE — Telephone Encounter (Signed)
Prescription Request  02/19/2023  LOV: 12/10/2022  What is the name of the medication or equipment? methocarbamol (ROBAXIN) 500 MG tablet oxyCODONE-acetaminophen (ROXICET) 5-325 MG tablet    Have you contacted your pharmacy to request a refill? No   Which pharmacy would you like this sent to?  [name],  - [address] [place] [address] Phone: [phone] Fax: [phone]    Patient notified that their request is being sent to the clinical staff for review and that they should receive a response within 2 business days.   Please advise at [facility] [phone]

## 2023-02-19 NOTE — Telephone Encounter (Signed)
Name of Medication: Oxycodone Name of [facility]acy: [facility] Last Fill or Written Date and Quantity: 01/06/23 #75 Last Office Visit and Type: 12-10-22 Next Office Visit and Type: 03-11-23 Last Controlled Substance Agreement Date: 02-12-22 Last UDS:02-12-22  Methocarbamol last written 09-11-22 #60/1

## 2023-03-11 ENCOUNTER — Encounter: Payer: Medicare Other | Admitting: Internal Medicine

## 2023-03-25 ENCOUNTER — Encounter: Payer: Medicare Other | Admitting: Internal Medicine

## 2023-04-06 ENCOUNTER — Other Ambulatory Visit: Payer: Self-pay

## 2023-04-06 NOTE — Telephone Encounter (Signed)
 Copied from CRM [id number]. Topic: Clinical - Medication Refill >> Apr 06, 2023  4:26 PM Burnard DEL wrote: Most Recent Primary Care Visit:  Provider: JIMMY ADE I  Department: [facility]  Visit Type: OFFICE VISIT  Date: 12/10/2022  Medication: oxyCODONE -acetaminophen  (ROXICET) 5-325 MG tablet  Has the patient contacted their pharmacy? No (Agent: If no, request that the patient contact the pharmacy for the refill. If patient does not wish to contact the pharmacy document the reason why and proceed with request.) (Agent: If yes, when and what did the pharmacy advise?)  Is this the correct pharmacy for this prescription? Yes If no, delete pharmacy and type the correct one.  This is the patient's preferred pharmacy:  [id number] [place] - [address] [place] [address] Phone: [phone] Fax: [phone]   Has the prescription been filled recently? Yes  Is the patient out of the medication? No  Has the patient been seen for an appointment in the last year OR does the patient have an upcoming appointment? Yes  Can we respond through MyChart? Yes  Agent: Please be advised that Rx refills may take up to 3 business days. We ask that you follow-up with your pharmacy.

## 2023-04-07 MED ORDER — OXYCODONE-ACETAMINOPHEN 5-325 MG PO TABS: 1.0000 | ORAL_TABLET | Freq: Three times a day (TID) | ORAL | 0 refills | Status: DC | PRN

## 2023-04-12 ENCOUNTER — Encounter: Payer: Medicare Other | Admitting: Internal Medicine

## 2023-04-29 ENCOUNTER — Encounter: Payer: Self-pay | Admitting: Internal Medicine

## 2023-04-29 ENCOUNTER — Ambulatory Visit (INDEPENDENT_AMBULATORY_CARE_PROVIDER_SITE_OTHER): Payer: Medicare Other | Admitting: Internal Medicine

## 2023-04-29 VITALS — BP 108/68 | HR 66 | Temp 97.7°F | Ht 68.0 in | Wt 141.0 lb

## 2023-04-29 DIAGNOSIS — Z0000 Encounter for general adult medical examination without abnormal findings: Secondary | ICD-10-CM | POA: Diagnosis not present

## 2023-04-29 DIAGNOSIS — F39 Unspecified mood [affective] disorder: Secondary | ICD-10-CM

## 2023-04-29 DIAGNOSIS — F1120 Opioid dependence, uncomplicated: Secondary | ICD-10-CM

## 2023-04-29 DIAGNOSIS — Z125 Encounter for screening for malignant neoplasm of prostate: Secondary | ICD-10-CM

## 2023-04-29 DIAGNOSIS — I10 Essential (primary) hypertension: Secondary | ICD-10-CM | POA: Diagnosis not present

## 2023-04-29 DIAGNOSIS — M5416 Radiculopathy, lumbar region: Secondary | ICD-10-CM

## 2023-04-29 DIAGNOSIS — K649 Unspecified hemorrhoids: Secondary | ICD-10-CM | POA: Diagnosis not present

## 2023-04-29 NOTE — Assessment & Plan Note (Signed)
PDMP reviewed No concerns

## 2023-04-29 NOTE — Assessment & Plan Note (Signed)
I have personally reviewed the Medicare Annual Wellness questionnaire and have noted 1. The patient's medical and social history 2. Their use of alcohol, tobacco or illicit drugs 3. Their current medications and supplements 4. The patient's functional ability including ADL's, fall risks, home safety risks and hearing or visual             impairment. 5. Diet and physical activities 6. Evidence for depression or mood disorders  The patients weight, height, BMI and visual acuity have been recorded in the chart I have made referrals, counseling and provided education to the patient based review of the above and I have provided the pt with a written personalized care plan for preventive services.  I have provided you with a copy of your personalized plan for preventive services. Please take the time to review along with your updated medication list.  Colon due next year Will check PSA Stays active and does resistance, etc Still doesn't want any vaccines

## 2023-04-29 NOTE — Assessment & Plan Note (Signed)
Still able to work full time Takes the oxycodone 1-2 per day

## 2023-04-29 NOTE — Progress Notes (Signed)
Hearing Screening - Comments:: Did not pass whisper test.  Vision Screening - Comments:: August 2024

## 2023-04-29 NOTE — Assessment & Plan Note (Signed)
Mood better with less marital stress Still anxious --mostly with work No meds needed

## 2023-04-29 NOTE — Assessment & Plan Note (Signed)
Ongoing problems--may need to see surgeon about this
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
## 2023-04-29 NOTE — Assessment & Plan Note (Signed)
BP Readings from Last 3 Encounters:  04/29/23 108/68  12/10/22 110/60  10/28/22 (!) 115/58   Fine without meds still

## 2023-04-30 ENCOUNTER — Encounter: Payer: Self-pay | Admitting: Internal Medicine

## 2023-04-30 LAB — HEPATIC FUNCTION PANEL
ALT: 12 U/L (ref 0–53)
AST: 16 U/L (ref 0–37)
Albumin: 4.3 g/dL (ref 3.5–5.2)
Alkaline Phosphatase: 73 U/L (ref 39–117)
Bilirubin, Direct: 0.1 mg/dL (ref 0.0–0.3)
Total Bilirubin: 0.6 mg/dL (ref 0.2–1.2)
Total Protein: 6.6 g/dL (ref 6.0–8.3)

## 2023-04-30 LAB — CBC
HCT: 37.6 % — ABNORMAL LOW (ref 39.0–52.0)
Hemoglobin: 12.6 g/dL — ABNORMAL LOW (ref 13.0–17.0)
MCHC: 33.6 g/dL (ref 30.0–36.0)
MCV: 96.5 fL (ref 78.0–100.0)
Platelets: 314 10*3/uL (ref 150.0–400.0)
RBC: 3.89 Mil/uL — ABNORMAL LOW (ref 4.22–5.81)
RDW: 15.7 % — ABNORMAL HIGH (ref 11.5–15.5)
WBC: 8.4 10*3/uL (ref 4.0–10.5)

## 2023-04-30 LAB — LIPID PANEL
Cholesterol: 173 mg/dL (ref 0–200)
HDL: 50.6 mg/dL (ref >39.00)
LDL Cholesterol: 98 mg/dL (ref 0–99)
NonHDL: 122.76
Total CHOL/HDL Ratio: 3
Triglycerides: 123 mg/dL (ref 0.0–149.0)
VLDL: 24.6 mg/dL (ref 0.0–40.0)

## 2023-04-30 LAB — RENAL FUNCTION PANEL
Albumin: 4.3 g/dL (ref 3.5–5.2)
BUN: 15 mg/dL (ref 6–23)
CO2: 31 meq/L (ref 19–32)
Calcium: 9 mg/dL (ref 8.4–10.5)
Chloride: 103 meq/L (ref 96–112)
Creatinine, Ser: 1.37 mg/dL (ref 0.40–1.50)
GFR: 52.68 mL/min — ABNORMAL LOW (ref >60.00)
Glucose, Bld: 81 mg/dL (ref 70–99)
Phosphorus: 4 mg/dL (ref 2.3–4.6)
Potassium: 3.9 meq/L (ref 3.5–5.1)
Sodium: 143 meq/L (ref 135–145)

## 2023-04-30 LAB — PSA, MEDICARE: PSA: 2.31 ng/mL (ref 0.10–4.00)

## 2023-04-30 LAB — SEDIMENTATION RATE: Sed Rate: 4 mm/h (ref 0–20)

## 2023-04-30 NOTE — Telephone Encounter (Signed)
Note created and pt is coming to get it.

## 2023-04-30 NOTE — Telephone Encounter (Signed)
Copied from CRM [id number]. Topic: General - Other >> Apr 30, 2023  9:48 AM Eunice Blase wrote: Reason for CRM: Received call from pt needs an excuse for Wed. Jan. 29To Fri. Jan 31st. Patient will be by to pick it up. Please call pt [phone]

## 2023-04-30 NOTE — Telephone Encounter (Signed)
Will forward to Dr Alphonsus Sias to approve.

## 2023-05-13 ENCOUNTER — Other Ambulatory Visit: Payer: Self-pay | Admitting: Internal Medicine

## 2023-05-13 MED ORDER — OXYCODONE-ACETAMINOPHEN 5-325 MG PO TABS: 1.0000 | ORAL_TABLET | Freq: Three times a day (TID) | ORAL | 0 refills | Status: DC | PRN

## 2023-05-13 NOTE — Telephone Encounter (Signed)
Copied from CRM [id number]. Topic: Clinical - Medication Refill >> May 13, 2023 10:52 AM Isabell A wrote: Most Recent Primary Care Visit:  Provider: Tillman Abide I  Department: [facility]  Visit Type: PHYSICAL  Date: 04/29/2023  Medication: oxyCODONE-acetaminophen (ROXICET) 5-325 MG tablet  Has the patient contacted their pharmacy? No (Agent: If no, request that the patient contact the pharmacy for the refill. If patient does not wish to contact the pharmacy document the reason why and proceed with request.) (Agent: If yes, when and what did the pharmacy advise?)  Is this the correct pharmacy for this prescription? Yes If no, delete pharmacy and type the correct one.  This is the patient's preferred pharmacy:  [phone] [place] - [address] [place] [address] Phone: [phone] Fax: [phone]   Has the prescription been filled recently? Yes  Is the patient out of the medication? No  Has the patient been seen for an appointment in the last year OR does the patient have an upcoming appointment? Yes  Can we respond through MyChart? No  Agent: Please be advised that Rx refills may take up to 3 business days. We ask that you follow-up with your pharmacy.

## 2023-05-13 NOTE — Telephone Encounter (Signed)
Name of Medication: Oxycodone Name of [facility]acy: [facility] Last Fill or Written Date and Quantity: 04-07-23 #75 Last Office Visit and Type: 04-29-23 Next Office Visit and Type: 07-29-23 Last Controlled Substance Agreement Date: 02-12-22 Last UDS:02-12-22

## 2023-06-18 ENCOUNTER — Ambulatory Visit: Admitting: Internal Medicine

## 2023-06-21 ENCOUNTER — Other Ambulatory Visit: Payer: Self-pay | Admitting: Internal Medicine

## 2023-06-22 MED ORDER — OXYCODONE-ACETAMINOPHEN 5-325 MG PO TABS: 1.0000 | ORAL_TABLET | Freq: Three times a day (TID) | ORAL | 0 refills | Status: DC | PRN

## 2023-06-22 NOTE — Telephone Encounter (Signed)
 Name of Medication: Oxycodone Name of [facility]acy: [facility] Last Fill or Written Date and Quantity: 05-13-23 #75 Last Office Visit and Type: 04-29-23 Next Office Visit and Type: 07-29-23 Last Controlled Substance Agreement Date: 02-12-22 Last UDS:02-12-22

## 2023-06-24 ENCOUNTER — Ambulatory Visit: Admitting: Internal Medicine

## 2023-07-29 ENCOUNTER — Ambulatory Visit: Payer: Medicare Other | Admitting: Internal Medicine

## 2023-08-02 ENCOUNTER — Encounter: Payer: Self-pay | Admitting: Internal Medicine

## 2023-08-02 ENCOUNTER — Ambulatory Visit (INDEPENDENT_AMBULATORY_CARE_PROVIDER_SITE_OTHER): Admitting: Internal Medicine

## 2023-08-02 VITALS — BP 124/70 | HR 53 | Temp 98.5°F | Ht 68.0 in | Wt 143.0 lb

## 2023-08-02 DIAGNOSIS — M5416 Radiculopathy, lumbar region: Secondary | ICD-10-CM

## 2023-08-02 DIAGNOSIS — F1120 Opioid dependence, uncomplicated: Secondary | ICD-10-CM | POA: Diagnosis not present

## 2023-08-02 MED ORDER — OXYCODONE-ACETAMINOPHEN 5-325 MG PO TABS: 1.0000 | ORAL_TABLET | Freq: Three times a day (TID) | ORAL | 0 refills | Status: DC | PRN

## 2023-08-02 NOTE — Assessment & Plan Note (Signed)
 PDMP reviewed No concerns

## 2023-08-02 NOTE — Assessment & Plan Note (Signed)
 Chronic pain Able to stay active/employed with the oxycodone 

## 2023-08-02 NOTE — Progress Notes (Signed)
 Subjective:    Patient ID: Jeffrey Mcbride, male    DOB: Sep 19, 1955, 68 y.o.   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     methocarbamol  (ROBAXIN ) 500 MG tablet Take 1 tablet (500 mg total) by mouth 2 (two) times daily as needed for muscle spasms. 60 tablet 1   oxyCODONE -acetaminophen  (ROXICET) 5-325 MG tablet Take 1 tablet by mouth 3 (three) times daily as needed for severe pain (pain score 7-10). 75 tablet 0   No current facility-administered medications on file prior to visit.    Allergies  Allergen Reactions   Latex    Motrin [Ibuprofen] Other (See Comments)    High doses gives pt gas   Naprosyn [Naproxen] Other (See Comments)    Elevated blood pressure    Past Medical History:  Diagnosis Date   Allergic rhinitis due to pollen    Back pain    Diverticulosis    External hemorrhoids    Hypertension    Insomnia    Internal hemorrhoids    Lumbar back pain with radiculopathy affecting left lower extremity    Tubular adenoma of colon     Past Surgical History:  Procedure Laterality Date   BACK SURGERY     LUMBAR DISC SURGERY  2004   LUMBAR FUSION     Dr Rochelle Chu   VASECTOMY      Family History  Problem Relation Age of Onset   Heart disease Mother    Hyperlipidemia Mother    Heart disease Father    Hyperlipidemia Father    Alcohol abuse Sister    Breast cancer Daughter    Esophageal cancer Neg Hx    Colon cancer Neg Hx    Rectal cancer Neg Hx    Stomach cancer Neg Hx     Social History   Socioeconomic  History   Marital status: Married    Spouse name: Not on file   Number of children: 3   Years of education: Not on file   Highest education level: Not on file  Occupational History   Occupation: HVAC--disabled   Occupation: Solicitor  Tobacco Use   Smoking status: Never    Passive exposure: Never   Smokeless tobacco: Never  Vaping Use   Vaping status: Never Used  Substance and Sexual Activity   Alcohol use: Yes    Alcohol/week: 0.0 standard drinks of alcohol    Comment: "very little"   Drug use: No   Sexual activity: Not on file  Other Topics Concern   Not on file  Social History Narrative   3 daughters   Disabled due to back problems   Caregiver for wife-- and helps with multiple grandchildren      No living will   Would want wife to make decisions---alternate daughter Alvy Baar   Would accept resuscitation attempts   Not sure about tube feeds--doesn't want if cognitively unaware   Wants to donate any organs possible   Social Drivers of [employer]  Resource Strain: Not on file  Food Insecurity: Not on file  Transportation Needs: Not on file  Physical Activity: Not on file  Stress: Not on file  Social Connections: Not on file  Intimate Partner Violence: Not on file   Review of Systems Got his new dentures--still getting used to it Not sleeping great--chronic issues Weight is not up--still having trouble chewing    Objective:   Physical Exam Constitutional:      Appearance: Normal appearance.  Neurological:     Mental Status: He is alert.  Psychiatric:        Mood and Affect: Mood normal.        Behavior: Behavior normal.            Assessment & Plan:

## 2023-08-05 ENCOUNTER — Encounter: Payer: Self-pay | Admitting: Internal Medicine

## 2023-08-05 LAB — DRUG MONITORING, PANEL 8 WITH CONFIRMATION, URINE
6 Acetylmorphine: NEGATIVE ng/mL (ref <10)
Alcohol Metabolites: NEGATIVE ng/mL (ref <500)
Amphetamines: NEGATIVE ng/mL (ref <500)
Benzodiazepines: NEGATIVE ng/mL (ref <100)
Buprenorphine, Urine: NEGATIVE ng/mL (ref <5)
Cocaine Metabolite: NEGATIVE ng/mL (ref <150)
Codeine: NEGATIVE ng/mL (ref <50)
Creatinine: 249.5 mg/dL (ref >=20.0)
Hydrocodone: NEGATIVE ng/mL (ref <50)
Hydromorphone: NEGATIVE ng/mL (ref <50)
MDMA: NEGATIVE ng/mL (ref <500)
Marijuana Metabolite: 1800 ng/mL — ABNORMAL HIGH (ref <5)
Marijuana Metabolite: POSITIVE ng/mL — AB (ref <20)
Morphine: NEGATIVE ng/mL (ref <50)
Norhydrocodone: NEGATIVE ng/mL (ref <50)
Noroxycodone: 3913 ng/mL — ABNORMAL HIGH (ref <50)
Opiates: NEGATIVE ng/mL (ref <100)
Oxidant: NEGATIVE ug/mL (ref <200)
Oxycodone: 1332 ng/mL — ABNORMAL HIGH (ref <50)
Oxycodone: POSITIVE ng/mL — AB (ref <100)
Oxymorphone: 1437 ng/mL — ABNORMAL HIGH (ref <50)
pH: 5.2 (ref 4.5–9.0)

## 2023-08-05 LAB — DM TEMPLATE

## 2023-09-07 ENCOUNTER — Other Ambulatory Visit: Payer: Self-pay | Admitting: Internal Medicine

## 2023-09-07 MED ORDER — OXYCODONE-ACETAMINOPHEN 5-325 MG PO TABS: 1.0000 | ORAL_TABLET | Freq: Three times a day (TID) | ORAL | 0 refills | Status: DC | PRN

## 2023-09-07 NOTE — Telephone Encounter (Signed)
 Name of Medication: Oxycodone  Name of [facility]acy: [facility] Last Fill or Written Date and Quantity: 08-02-23 #75 Last Office Visit and Type: 08-02-23 Next Office Visit and Type: 11-04-23 Last Controlled Substance Agreement Date: 08-02-23 Last UDS:08-02-23

## 2023-09-07 NOTE — Telephone Encounter (Unsigned)
 Copied from CRM [id number]. Topic: Clinical - Medication Refill >> Sep 07, 2023  2:18 PM Baldo Levan wrote: Medication:  oxyCODONE -acetaminophen  oxyCODONE -acetaminophen  (ROXICET) 5-325 MG tablet   Has the patient contacted their pharmacy? Yes (Agent: If no, request that the patient contact the pharmacy for the refill. If patient does not wish to contact the pharmacy document the reason why and proceed with request.) (Agent: If yes, when and what did the pharmacy advise?)  This is the patient's preferred pharmacy:  [phone] [place] - [address] [place]mi [place] [address] Phone: [phone] Fax: [phone]  Is this the correct pharmacy for this prescription? Yes If no, delete pharmacy and type the correct one.   Has the prescription been filled recently? No  Is the patient out of the medication? Yes  Has the patient been seen for an appointment in the last year OR does the patient have an upcoming appointment? Yes  Can we respond through MyChart? No  Agent: Please be advised that Rx refills may take up to 3 business days. We ask that you follow-up with your pharmacy.

## 2023-09-07 NOTE — Telephone Encounter (Signed)
 Last Fill: 08/02/23 75 tabs/0 RF  Last OV: 08/02/23 Next OV: 11/04/23  Routing to provider for review/authorization.

## 2023-10-12 ENCOUNTER — Other Ambulatory Visit: Payer: Self-pay | Admitting: Internal Medicine

## 2023-10-12 MED ORDER — OXYCODONE-ACETAMINOPHEN 5-325 MG PO TABS: 1.0000 | ORAL_TABLET | Freq: Three times a day (TID) | ORAL | 0 refills | Status: DC | PRN

## 2023-10-12 NOTE — Telephone Encounter (Unsigned)
 Copied from CRM [id number]. Topic: Clinical - Medication Refill >> Oct 12, 2023 10:41 AM [place] wrote: Medication: oxyCODONE -acetaminophen  (ROXICET) 5-325 MG tablet  Has the patient contacted their pharmacy? Yes (Agent: If no, request that the patient contact the pharmacy for the refill. If patient does not wish to contact the pharmacy document the reason why and proceed with request.) (Agent: If yes, when and what did the pharmacy advise?)  This is the patient's preferred pharmacy:  [phone] [facility], [place] - [address] [place] [address] Phone: [phone] Fax: [phone]  Is this the correct pharmacy for this prescription? Yes If no, delete pharmacy and type the correct one.   Has the prescription been filled recently? Yes  Is the patient out of the medication? Yes  Has the patient been seen for an appointment in the last year OR does the patient have an upcoming appointment? Yes  Can we respond through MyChart? Yes  Agent: Please be advised that Rx refills may take up to 3 business days. We ask that you follow-up with your pharmacy.

## 2023-10-12 NOTE — Telephone Encounter (Signed)
 Name of Medication: Oxycodone  [facility] Last Fill or Written Date and Quantity: 09-07-23 #75 Last Office Visit and Type: 08-02-23 Next Office Visit and Type: 11-04-23 Last Controlled Substance Agreement Date: 08-02-23 Last UDS:08-02-23

## 2023-10-22 ENCOUNTER — Ambulatory Visit (INDEPENDENT_AMBULATORY_CARE_PROVIDER_SITE_OTHER): Admitting: Internal Medicine

## 2023-10-22 ENCOUNTER — Encounter: Payer: Self-pay | Admitting: Internal Medicine

## 2023-10-22 VITALS — BP 130/80 | HR 60 | Temp 98.1°F | Ht 68.0 in | Wt 135.0 lb

## 2023-10-22 DIAGNOSIS — F1120 Opioid dependence, uncomplicated: Secondary | ICD-10-CM

## 2023-10-22 DIAGNOSIS — M5416 Radiculopathy, lumbar region: Secondary | ICD-10-CM

## 2023-10-22 NOTE — Assessment & Plan Note (Signed)
 PDMP reviewed No concerns

## 2023-10-22 NOTE — Assessment & Plan Note (Signed)
 Exacerbated with overdoing it at work Nothing to suggest spinal changes--mostly strain (muscular) Continue regular meds Is looking for less physically stressful job

## 2023-10-22 NOTE — Progress Notes (Signed)
 Subjective:    Patient ID: Jeffrey Mcbride, male    DOB: [birth year], 68 y.o.   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     methocarbamol  (ROBAXIN ) 500 MG tablet Take 1 tablet (500 mg total) by mouth 2 (two) times daily as needed for muscle spasms. 60 tablet 1   oxyCODONE -acetaminophen  (ROXICET) 5-325 MG tablet Take 1 tablet by mouth 3 (three) times daily as needed for severe pain (pain score 7-10). 75 tablet 0   No current facility-administered medications on file prior to visit.    Allergies  Allergen Reactions   Latex    Motrin [Ibuprofen] Other (See Comments)    High doses gives pt gas   Naprosyn [Naproxen] Other (See Comments)    Elevated blood pressure    Past Medical History:  Diagnosis Date   Allergic rhinitis due to pollen    Back pain    Diverticulosis    External hemorrhoids    Hypertension    Insomnia    Internal hemorrhoids    Lumbar back pain with radiculopathy affecting left lower extremity    Tubular adenoma of colon     Past Surgical History:  Procedure Laterality Date   BACK SURGERY     LUMBAR DISC SURGERY  2004   LUMBAR FUSION     Dr Joshua   VASECTOMY      Family History  Problem Relation Age of Onset    Heart disease Mother    Hyperlipidemia Mother    Heart disease Father    Hyperlipidemia Father    Alcohol abuse Sister    Breast cancer Daughter    Esophageal cancer Neg Hx    Colon cancer Neg Hx    Rectal cancer Neg Hx    Stomach cancer Neg Hx     Social History   Socioeconomic History   Marital status: Married    Spouse name: Not on file   Number of children: 3   Years of education: Not on file   Highest education level: Not on file  Occupational History   Occupation: HVAC--disabled   Occupation: Solicitor  Tobacco Use   Smoking status: Never    Passive exposure: Never   Smokeless tobacco: Never  Vaping Use   Vaping status: Never Used  Substance and Sexual Activity   Alcohol use: Yes    Alcohol/week: 0.0 standard drinks of alcohol    Comment: very little   Drug use: No   Sexual activity: Not on file  Other Topics Concern   Not on file  Social History Narrative   3 daughters   Disabled due to back problems   Caregiver for wife-- and helps with multiple  grandchildren      No living will   Would want wife to make decisions---alternate daughter Rojelio   Would accept resuscitation attempts   Not sure about tube feeds--doesn't want if cognitively unaware   Wants to donate any organs possible   Social Drivers of Corporate investment banker Strain: Not on file  Food Insecurity: Not on file  Transportation Needs: Not on file  Physical Activity: Not on file  Stress: Not on file  Social Connections: Not on file  Intimate Partner Violence: Not on file   Review of Systems      Objective:   Physical Exam Neck:     Comments: Right upper cervical node--non tender No other nodes--head, supraclavicular, axillary, etc Musculoskeletal:     Comments: Some general spine and lower back tenderness SLR negative Right hip ROM is normal Left hip--mild decreased internal rotation--but normal external rotation            Assessment & Plan:

## 2023-10-25 ENCOUNTER — Ambulatory Visit: Admitting: Internal Medicine

## 2023-11-04 ENCOUNTER — Ambulatory Visit: Admitting: Internal Medicine
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
## 2023-11-12 ENCOUNTER — Other Ambulatory Visit: Payer: Self-pay | Admitting: Internal Medicine

## 2023-11-12 MED ORDER — OXYCODONE-ACETAMINOPHEN 5-325 MG PO TABS: 1.0000 | ORAL_TABLET | Freq: Three times a day (TID) | ORAL | 0 refills | Status: DC | PRN

## 2023-11-12 NOTE — Telephone Encounter (Signed)
 Copied from CRM #[id number]. Topic: Clinical - Medication Refill >> Nov 12, 2023 10:37 AM Abigail D wrote: Medication: oxyCODONE -acetaminophen  (ROXICET) 5-325 MG tablet  Has the patient contacted their pharmacy? No (Agent: If no, request that the patient contact the pharmacy for the refill. If patient does not wish to contact the pharmacy document the reason why and proceed with request.) (Agent: If yes, when and what did the pharmacy advise?)  This is the patient's preferred pharmacy:  [phone] [facility], [place] - [address] [place] [address] Phone: [phone] Fax: [phone]  Is this the correct pharmacy for this prescription? Yes If no, delete pharmacy and type the correct one.   Has the prescription been filled recently? No  Is the patient out of the medication? Yes  Has the patient been seen for an appointment in the last year OR does the patient have an upcoming appointment? Yes  Can we respond through MyChart? No  Agent: Please be advised that Rx refills may take up to 3 business days. We ask that you follow-up with your pharmacy.

## 2023-11-15 ENCOUNTER — Ambulatory Visit: Payer: Self-pay | Admitting: Internal Medicine

## 2023-11-15 ENCOUNTER — Ambulatory Visit (INDEPENDENT_AMBULATORY_CARE_PROVIDER_SITE_OTHER): Admitting: Internal Medicine

## 2023-11-15 ENCOUNTER — Encounter: Payer: Self-pay | Admitting: Internal Medicine

## 2023-11-15 VITALS — BP 130/74 | HR 47 | Temp 98.5°F | Ht 68.0 in | Wt 134.0 lb

## 2023-11-15 DIAGNOSIS — R221 Localized swelling, mass and lump, neck: Secondary | ICD-10-CM | POA: Insufficient documentation

## 2023-11-15 LAB — COMPREHENSIVE METABOLIC PANEL WITH GFR
ALT: 14 U/L (ref 0–53)
AST: 19 U/L (ref 0–37)
Albumin: 4.4 g/dL (ref 3.5–5.2)
Alkaline Phosphatase: 77 U/L (ref 39–117)
BUN: 11 mg/dL (ref 6–23)
CO2: 31 meq/L (ref 19–32)
Calcium: 9.1 mg/dL (ref 8.4–10.5)
Chloride: 104 meq/L (ref 96–112)
Creatinine, Ser: 1.25 mg/dL (ref 0.40–1.50)
GFR: 58.58 mL/min — ABNORMAL LOW (ref >60.00)
Glucose, Bld: 94 mg/dL (ref 70–99)
Potassium: 4 meq/L (ref 3.5–5.1)
Sodium: 142 meq/L (ref 135–145)
Total Bilirubin: 0.5 mg/dL (ref 0.2–1.2)
Total Protein: 6.9 g/dL (ref 6.0–8.3)

## 2023-11-15 LAB — CBC
HCT: 35.6 % — ABNORMAL LOW (ref 39.0–52.0)
Hemoglobin: 12 g/dL — ABNORMAL LOW (ref 13.0–17.0)
MCHC: 33.7 g/dL (ref 30.0–36.0)
MCV: 95.6 fl (ref 78.0–100.0)
Platelets: 311 K/uL (ref 150.0–400.0)
RBC: 3.72 Mil/uL — ABNORMAL LOW (ref 4.22–5.81)
RDW: 15.5 % (ref 11.5–15.5)
WBC: 7 K/uL (ref 4.0–10.5)

## 2023-11-15 LAB — SEDIMENTATION RATE: Sed Rate: 5 mm/h (ref 0–20)

## 2023-11-15 MED ORDER — OXYCODONE-ACETAMINOPHEN 5-325 MG PO TABS: 1.0000 | ORAL_TABLET | Freq: Three times a day (TID) | ORAL | 0 refills | Status: DC | PRN
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
## 2023-11-15 NOTE — Progress Notes (Signed)
 Subjective:    Patient ID: Jeffrey Mcbride, male    DOB: 02-14-1956, 68 y.o.   MRN: [id number]  HPI Here due to ongoing trouble with appetite and neck lump  Lump on right neck is still bothering him Appetite has gone down to nothing Trying with or without his teeth---but no pain with eating  Some issues with nerves Still looking for a different job  Current Outpatient Medications on File Prior to Visit  Medication Sig Dispense Refill   ibuprofen (ADVIL) 200 MG tablet Take 200 mg by mouth every 6 (six) hours as needed.     methocarbamol  (ROBAXIN ) 500 MG tablet Take 1 tablet (500 mg total) by mouth 2 (two) times daily as needed for muscle spasms. 60 tablet 1   oxyCODONE -acetaminophen  (ROXICET) 5-325 MG tablet Take 1 tablet by mouth 3 (three) times daily as needed for severe pain (pain score 7-10). 75 tablet 0   No current facility-administered medications on file prior to visit.    Allergies  Allergen Reactions   Latex    Motrin [Ibuprofen] Other (See Comments)    High doses gives pt gas   Naprosyn [Naproxen] Other (See Comments)    Elevated blood pressure    Past Medical History:  Diagnosis Date   Allergic rhinitis due to pollen    Back pain    Diverticulosis    External hemorrhoids    Hypertension    Insomnia    Internal hemorrhoids    Lumbar back pain with radiculopathy affecting left lower extremity    Tubular adenoma of colon     Past Surgical History:  Procedure Laterality Date   BACK SURGERY     LUMBAR DISC SURGERY  2004   LUMBAR FUSION     Dr Joshua   VASECTOMY      Family History  Problem Relation Age of Onset   Heart disease Mother    Hyperlipidemia Mother    Heart disease Father    Hyperlipidemia Father    Alcohol abuse Sister    Breast cancer Daughter    Esophageal cancer Neg Hx    Colon cancer Neg Hx    Rectal cancer Neg Hx    Stomach cancer Neg Hx     Social History   Socioeconomic History   Marital status: Married    Spouse  name: Not on file   Number of children: 3   Years of education: Not on file   Highest education level: Not on file  Occupational History   Occupation: HVAC--disabled   Occupation: Solicitor  Tobacco Use   Smoking status: Never    Passive exposure: Never   Smokeless tobacco: Never  Vaping Use   Vaping status: Never Used  Substance and Sexual Activity   Alcohol use: Yes    Alcohol/week: 0.0 standard drinks of alcohol    Comment: very little   Drug use: No   Sexual activity: Not on file  Other Topics Concern   Not on file  Social History Narrative   3 daughters   Disabled due to back problems   Caregiver for wife-- and helps with multiple grandchildren      No living will   Would want wife to make decisions---alternate daughter Rojelio   Would accept resuscitation attempts   Not sure about tube feeds--doesn't want if cognitively unaware   Wants to donate any organs possible   Social Drivers of Corporate investment banker Strain: Not on file  Food Insecurity: Not  on file  Transportation Needs: Not on file  Physical Activity: Not on file  Stress: Not on file  Social Connections: Not on file  Intimate Partner Violence: Not on file   Review of Systems     Objective:   Physical Exam Neck:     Comments: Right anterior cervical mass is much larger now No other adenopathy           Assessment & Plan:

## 2023-11-15 NOTE — Addendum Note (Signed)
 Addended by: KALLIE CLOTILDA SQUIBB on: 11/15/2023 08:21 AM   Modules accepted: Orders

## 2023-11-15 NOTE — Assessment & Plan Note (Signed)
 Much larger now and is suspicious  Will refer to general surgeon for biopsy/excision Check labs

## 2023-11-23 ENCOUNTER — Telehealth: Payer: Self-pay

## 2023-11-23 NOTE — Telephone Encounter (Signed)
 Copied from CRM [id number]. Topic: Referral - Status >> Nov 23, 2023 12:03 PM Mercedes MATSU wrote: Reason for CRM: Patient called and wanted to know the status of his referral, patient can be reached at [phone].

## 2023-11-23 NOTE — Telephone Encounter (Signed)
 Spoke to pt. The referral is still pending. I will forward to the referral coordinators to see if they have an update.

## 2023-12-03 ENCOUNTER — Encounter: Payer: Self-pay | Admitting: *Deleted

## 2023-12-03 NOTE — Telephone Encounter (Signed)
 His referral was sent to [facility]. I have sent him a Medical laboratory scientific officer and Msg with referral information and scheduling instructions.

## 2023-12-03 NOTE — Telephone Encounter (Signed)
 Thank you :)

## 2023-12-21 ENCOUNTER — Other Ambulatory Visit: Payer: Self-pay

## 2023-12-21 MED ORDER — OXYCODONE-ACETAMINOPHEN 5-325 MG PO TABS: 1.0000 | ORAL_TABLET | Freq: Three times a day (TID) | ORAL | 0 refills | Status: DC | PRN

## 2023-12-21 NOTE — Telephone Encounter (Signed)
 Name of Medication: Oxycodone  [facility] Last Fill or Written Date and Quantity: 11-15-23 #75 Last Office Visit and Type: 11-15-23 acute Next Office Visit and Type: 01-21-24 Last Controlled Substance Agreement Date: 08-02-23 Last UDS:08-02-23

## 2023-12-21 NOTE — Telephone Encounter (Signed)
 Copied from CRM [id number]. Topic: Clinical - Medication Refill >> Dec 16, 2023  9:56 AM Anairis L wrote: Medication: oxyCODONE -acetaminophen  (ROXICET) 5-325 MG tablet  Has the patient contacted their pharmacy? No (Agent: If no, request that the patient contact the pharmacy for the refill. If patient does not wish to contact the pharmacy document the reason why and proceed with request.) (Agent: If yes, when and what did the pharmacy advise?)  This is the patient's preferred pharmacy:  [facility] [phone] [facility] [place] - [address] [place] [address] Phone: [phone] Fax: [phone]  Is this the correct pharmacy for this prescription? Yes If no, delete pharmacy and type the correct one.   Has the prescription been filled recently? Yes  Is the patient out of the medication? No  Has the patient been seen for an appointment in the last year OR does the patient have an upcoming appointment? Yes  Can we respond through MyChart? Yes  Agent: Please be advised that Rx refills may take up to 3 business days. We ask that you follow-up with your pharmacy. >> Dec 21, 2023  9:26 AM Rosina BIRCH wrote: Patient called stating he is checking on his medication refill because he is out

## 2024-01-21 ENCOUNTER — Ambulatory Visit

## 2024-01-21 VITALS — BP 130/72 | HR 63 | Temp 98.1°F | Ht 68.0 in | Wt 136.0 lb

## 2024-01-21 DIAGNOSIS — R221 Localized swelling, mass and lump, neck: Secondary | ICD-10-CM

## 2024-01-21 NOTE — Progress Notes (Signed)
 Subjective:   This visit was conducted in person. The patient gave informed consent to the use of Abridge AI technology to record the contents of the encounter as documented below.   Patient ID: Jeffrey Mcbride, male    DOB: 1956/01/28, 68 y.o.   MRN: [id number]   Discussed the use of AI scribe software for clinical note transcription with the patient, who gave verbal consent to proceed.  History of Present Illness Jeffrey Mcbride is a 68 year old male who presents with a firm, immobile neck mass.  Approximately three months ago, he noticed a mass on the right side of his neck. The mass fluctuates in size, enlarging with physical activity and reducing afterward, but remains consistently hard. He experiences occasional pressure on his esophagus, described as a 'weird' sensation. There is no associated pain. He has not had recent infections but had significant dental issues and infections before December, which were resolved with dental work.  He has a history of chronic back pain and poor sleep due to past back and stomach injuries from previous employment. He uses over-the-counter antihistamines to aid sleep but wakes frequently due to stress and discomfort. He reports a poor appetite and weight loss since his dental work in December, with a decrease from 145-150 pounds to an unspecified lower weight. Despite these issues, he maintains his physical activity level due to job demands.  No fever, night sweats, or significant chills, although he occasionally feels cold due to poor circulation in his feet, attributed to past exposure to cold temperatures. He experiences chronic itching, particularly at night, which he manages with topical moisturizers and occasionally with Zyrtec, though he is cautious about drowsiness due to job responsibilities.  Family history includes thyroid problems in his sister and possibly on his father's side. He has not noticed any other lumps or bumps on his body  and denies new body aches aside from those related to physical labor.     Review of Systems  All other systems reviewed and are negative.       Allergies  Allergen Reactions   Latex    Motrin [Ibuprofen] Other (See Comments)    High doses gives pt gas   Naprosyn [Naproxen] Other (See Comments)    Elevated blood pressure    Current Outpatient Medications on File Prior to Visit  Medication Sig Dispense Refill   ibuprofen (ADVIL) 200 MG tablet Take 200 mg by mouth every 6 (six) hours as needed.     methocarbamol  (ROBAXIN ) 500 MG tablet Take 1 tablet (500 mg total) by mouth 2 (two) times daily as needed for muscle spasms. 60 tablet 1   oxyCODONE -acetaminophen  (ROXICET) 5-325 MG tablet Take 1 tablet by mouth 3 (three) times daily as needed for severe pain (pain score 7-10). 75 tablet 0   No current facility-administered medications on file prior to visit.    BP 130/72 (BP Location: Left Arm, Patient Position: Sitting, Cuff Size: Normal)   Pulse 63   Temp 98.1 F (36.7 C) (Oral)   Ht 5' 8 (1.727 m)   Wt 136 lb (61.7 kg)   SpO2 96%   BMI 20.68 kg/m   Objective:      Physical Exam GENERAL: Alert, cooperative, well developed, no acute distress. HEAD: Normocephalic atraumatic. EXTREMITIES: No cyanosis or edema. NEUROLOGICAL: Oriented to person, place and time, no gait abnormalities, moves all extremities without gross motor or sensory deficit. NECK: Right lateral neck, 2x3 cm, firm, immobile, non-tender mass, see media.  Assessment & Plan:   Assessment & Plan 1. Neck mass (Primary) Firm, immobile, non-tender mass on right lateral neck for over three months. Differential includes lymphadenitis, autoimmune etiology, or cyst. No infection signs. Previous dental infections noted. Low suspicion for hematologic malignancy given lack of B symptoms and normal recent CBC. Will order autoimmune labs as below, ultrasound imaging to elucidate whether this is a cyst versus  lymph node, patient to contact general surgery for potential biopsy.  - Order ultrasound of head and neck. - Order ANA and CRP labs. - Instruct him to contact general surgery for appointment.    Return in about 2 weeks (around 02/04/2024) for Transition of Care visit with [facility]   Nicolaas Savo K Merrilyn Legler, MD  01/21/24     Contains text generated by Abridge.

## 2024-01-21 NOTE — Patient Instructions (Addendum)
 Thank you for visiting [facility] today! Here's what we talked about:                                                                                                                                                                                                             - Call General surgery:  [facility] Address: [address] [place], [place] [address] Phone: [phone]  - Ultrasound will call you. If in 5 days haven't heard from them, call [phone]

## 2024-01-24 ENCOUNTER — Ambulatory Visit: Payer: Self-pay

## 2024-01-24 DIAGNOSIS — R591 Generalized enlarged lymph nodes: Secondary | ICD-10-CM

## 2024-01-24 LAB — ANTI-NUCLEAR AB-TITER (ANA TITER): ANA Titer 1: 1:320 {titer} — ABNORMAL HIGH

## 2024-01-24 LAB — ANA: Anti Nuclear Antibody (ANA): POSITIVE — AB

## 2024-01-24 LAB — C-REACTIVE PROTEIN: CRP: <3 mg/L (ref <8.0)

## 2024-01-27 ENCOUNTER — Other Ambulatory Visit (INDEPENDENT_AMBULATORY_CARE_PROVIDER_SITE_OTHER)

## 2024-01-27 DIAGNOSIS — R591 Generalized enlarged lymph nodes: Secondary | ICD-10-CM

## 2024-01-27 NOTE — Progress Notes (Signed)
 Lvm and sent Mychart

## 2024-01-27 NOTE — Telephone Encounter (Signed)
 Copied from CRM #[id number]. Topic: Clinical - Lab/Test Results >> Jan 27, 2024  8:42 AM Viola FALCON wrote: Reason for CRM: Patient returned Renae's call, I relayed Dr. Charlyn message and scheduled lab appt for today at 9:45am

## 2024-01-28 LAB — ANTI-DNA ANTIBODY, DOUBLE-STRANDED: ds DNA Ab: <1 [IU]/mL

## 2024-01-28 LAB — SJOGREN'S SYNDROME ANTIBODS(SSA + SSB)
SSA (Ro) (ENA) Antibody, IgG: <1 AI
SSB (La) (ENA) Antibody, IgG: <1 AI

## 2024-01-28 LAB — RHEUMATOID FACTOR: Rheumatoid fact SerPl-aCnc: <10 [IU]/mL (ref <14)

## 2024-01-28 LAB — ANTI-SCLERODERMA ANTIBODY: Scleroderma (Scl-70) (ENA) Antibody, IgG: <1 AI

## 2024-01-28 LAB — ANTI-SMITH ANTIBODY: ENA SM Ab Ser-aCnc: <1 AI

## 2024-01-30 ENCOUNTER — Ambulatory Visit: Payer: Self-pay

## 2024-01-30 DIAGNOSIS — K148 Other diseases of tongue: Secondary | ICD-10-CM

## 2024-01-30 DIAGNOSIS — R591 Generalized enlarged lymph nodes: Secondary | ICD-10-CM

## 2024-02-04 ENCOUNTER — Ambulatory Visit (HOSPITAL_COMMUNITY)

## 2024-02-04 ENCOUNTER — Ambulatory Visit (HOSPITAL_BASED_OUTPATIENT_CLINIC_OR_DEPARTMENT_OTHER)
Admission: RE | Admit: 2024-02-04 | Discharge: 2024-02-04 | Disposition: A | Discharge status: Home / Self Care | Source: Ambulatory Visit

## 2024-02-04 DIAGNOSIS — R221 Localized swelling, mass and lump, neck: Secondary | ICD-10-CM | POA: Insufficient documentation

## 2024-02-07 ENCOUNTER — Telehealth: Payer: Self-pay | Admitting: Nurse Practitioner

## 2024-02-07 MED ORDER — OXYCODONE-ACETAMINOPHEN 5-325 MG PO TABS: 1.0000 | ORAL_TABLET | Freq: Three times a day (TID) | ORAL | 0 refills | Status: DC | PRN

## 2024-02-07 NOTE — Telephone Encounter (Signed)
 Copied from CRM [id number]. Topic: Clinical - Medication Refill >> Feb 07, 2024  4:14 PM Alfonso HERO wrote: Medication: oxyCODONE -acetaminophen  (ROXICET) 5-325 MG tablet    Has the patient contacted their pharmacy? No (Agent: If no, request that the patient contact the pharmacy for the refill. If patient does not wish to contact the pharmacy document the reason why and proceed with request.) (Agent: If yes, when and what did the pharmacy advise?)  This is the patient's preferred pharmacy[facility]  [phone] [facility] [place] - [address] [place] [address] Phone: [phone] Fax: [phone]  Is this the correct pharmacy for this prescription? Yes If no, delete pharmacy and type the correct one.   Has the prescription been filled recently? Yes  Is the patient out of the medication? Yes  Has the patient been seen for an appointment in the last year OR does the patient have an upcoming appointment? Yes  Can we respond through MyChart? Yes  Agent: Please be advised that Rx refills may take up to 3 business days. We ask that you follow-up with your pharmacy.

## 2024-02-07 NOTE — Telephone Encounter (Signed)
 Refill sent in to pharmacy

## 2024-02-09 NOTE — Addendum Note (Signed)
 Addended by: Depaul Arizpe K on: 02/09/2024 06:46 PM   Modules accepted: Orders

## 2024-02-18 ENCOUNTER — Telehealth: Payer: Self-pay

## 2024-02-18 NOTE — Telephone Encounter (Signed)
 I had called pt to call us  back about an appt for 11/24. Please let me know if he calles back. I have called 2 times. I will also be sending a Mychart message. Thank you.

## 2024-02-18 NOTE — Telephone Encounter (Signed)
 Copied from CRM #[id number]. Topic: General - Other >> Feb 18, 2024  8:24 AM Thersia BROCKS wrote: Reason for CRM: Patient called in regarding a missed call from office, would like a callback

## 2024-02-18 NOTE — Telephone Encounter (Signed)
 I did not call him and I cannot see who may have called him. Called him back. Left message on VM per DPR that I was unable to tell from the chart who may have called. If they left a name or a message, please call us  back so we can get it to the correct person.

## 2024-02-21 ENCOUNTER — Encounter: Admitting: Nurse Practitioner

## 2024-03-01 ENCOUNTER — Other Ambulatory Visit (HOSPITAL_COMMUNITY): Payer: Self-pay | Admitting: Surgery

## 2024-03-01 DIAGNOSIS — R590 Localized enlarged lymph nodes: Secondary | ICD-10-CM

## 2024-03-01 NOTE — Progress Notes (Unsigned)
 Jeffrey Mcbride POUR, MD  Jeffrey Mcbride Discussed with Dr. Eletha - please change to US  CORE BIOPSY of RIGHT NECK LYMPH NODE.  Send surg path for both lymphoma and possible head and neck cancer (saline and formalin).  No sedation  HKM       Previous Messages    ----- Message ----- From: Jeffrey Mcbride Sent: 03/01/2024  11:11 AM EST To: [name] Jeffrey; Taryn F Rigney, RT; Ir Proc* Subject: US  FNA BX THYROID  1ST LESION AFIRMA            Procedure :US  FNA BX THYROID  1ST LESION AFIRMA  Reason :Right lateral nech mass Dx: Cervical lymphadenopathy [R59.0 (ICD-10-CM)]    History :US  SOFT TISSUE HEAD & NECK (NON-THYROID ), Pt has a CT SOFT TISSUE NECK W CONTRAST schedule for 03/10/24  Provider: Eletha Boas, MD  Provider contact ;  [phone]

## 2024-03-10 ENCOUNTER — Ambulatory Visit (HOSPITAL_BASED_OUTPATIENT_CLINIC_OR_DEPARTMENT_OTHER): Admission: RE | Admit: 2024-03-10 | Discharge: 2024-03-10 | Disposition: A | Source: Ambulatory Visit

## 2024-03-10 ENCOUNTER — Other Ambulatory Visit: Payer: Self-pay

## 2024-03-10 DIAGNOSIS — R591 Generalized enlarged lymph nodes: Secondary | ICD-10-CM

## 2024-03-10 LAB — POCT I-STAT CREATININE: Creatinine, Ser: 1.5 mg/dL — ABNORMAL HIGH (ref 0.61–1.24)

## 2024-03-10 MED ORDER — IOHEXOL 300 MG/ML  SOLN
100.0000 mL | Freq: Once | INTRAMUSCULAR | Status: AC | PRN
  Administered 2024-03-10: 75 mL via INTRAVENOUS

## 2024-03-10 NOTE — Telephone Encounter (Signed)
 Copied from CRM #[id number]. Topic: Clinical - Medication Refill >> Mar 10, 2024  1:49 PM Suzen RAMAN wrote: Medication: oxyCODONE -acetaminophen  (ROXICET) 5-325 MG tablet   Has the patient contacted their pharmacy? Yes   This is the patient's preferred pharmacy:  [phone] [place] - [address] [place] [address] Phone: [phone] Fax: [phone]  Is this the correct pharmacy for this prescription? Yes If no, delete pharmacy and type the correct one.   Has the prescription been filled recently? No  Is the patient out of the medication? No  Has the patient been seen for an appointment in the last year OR does the patient have an upcoming appointment? Yes  Can we respond through MyChart? Yes  Agent: Please be advised that Rx refills may take up to 3 business days. We ask that you follow-up with your pharmacy.

## 2024-03-12 ENCOUNTER — Ambulatory Visit: Payer: Self-pay

## 2024-03-15 ENCOUNTER — Telehealth: Payer: Self-pay

## 2024-03-15 MED ORDER — OXYCODONE-ACETAMINOPHEN 5-325 MG PO TABS: 1.0000 | ORAL_TABLET | Freq: Three times a day (TID) | ORAL | 0 refills | Status: DC | PRN

## 2024-03-15 NOTE — Telephone Encounter (Signed)
 Name of Medication: Oxycodone  Name of [facility]acy[facility] Last Fill or Written Date and Quantity: 02-07-24-25 #75 Last Office Visit and Type: 01-21-24 acute with Dr Bennett Next Office Visit and Type: 05-03-24 TOC with Adina Crandall, NP Last Controlled Substance Agreement Date: 08-02-23 Last UDS:08-02-23

## 2024-03-15 NOTE — Telephone Encounter (Signed)
 Please see previous note already taken about this. Thank you

## 2024-03-15 NOTE — Telephone Encounter (Signed)
 03/15/24 10:09 AM Note Copied from CRM #[id number]. Topic: Clinical - Prescription Issue >> Mar 15, 2024  9:57 AM Rea ORN wrote: Reason for CRM: Pt's pharmacy requested refill for Oxycodone  Acetaminophen  on 12/12 and it has not been filled yet. Pt is completely out and would like to know when it will be filled.   Please call back [phone]

## 2024-03-15 NOTE — Telephone Encounter (Signed)
 Copied from CRM #[id number]. Topic: Clinical - Prescription Issue >> Mar 15, 2024  9:57 AM Rea ORN wrote: Reason for CRM: Pt's pharmacy requested refill for Oxycodone  Acetaminophen  on 12/12 and it has not been filled yet. Pt is completely out and would like to know when it will be filled.  Please call back [phone]

## 2024-03-17 NOTE — Progress Notes (Signed)
 CT scan results reviewed.  This appears to be a head & neck cancer arising from the base of the tongue.  Dr. Bennett is monitoring the workup closely - I will ask her to make referral to ENT Head & Neck surgery for consultation immediately.  I would not wait for biopsy by IR - may not be necessary.  ENT will make decision about best method of obtaining tissue diagnosis.  May also require evaluation by radiation therapy and medical oncology, but would let ENT dictate those referrals.  Will follow with you.  Krystal Spinner, MD [facility] practice Office: [phone]

## 2024-03-18 NOTE — Telephone Encounter (Signed)
 Please call patient and advise that I have sent an emergent referral to ENT so they can evaluate the mass at the base of his tongue seen on the CT.  They will make the decision about the next steps in his care. Their office will call him to schedule and I will continue to follow with his care. Routing to Dr. Eletha as an FYI

## 2024-03-21 ENCOUNTER — Encounter (INDEPENDENT_AMBULATORY_CARE_PROVIDER_SITE_OTHER): Payer: Self-pay

## 2024-03-21 ENCOUNTER — Telehealth: Payer: Self-pay

## 2024-03-21 NOTE — Telephone Encounter (Signed)
 Spoke to pt about results

## 2024-03-21 NOTE — Telephone Encounter (Signed)
 Copied from CRM [id number]. Topic: General - Call Back - No Documentation >> Mar 20, 2024  5:43 PM [name] wrote: Reason for CRM: Missed call from [facility] around 4PM. Requesting callback [phone]

## 2024-04-07 ENCOUNTER — Ambulatory Visit (HOSPITAL_COMMUNITY)
Admission: RE | Admit: 2024-04-07 | Discharge: 2024-04-07 | Disposition: A | Source: Ambulatory Visit | Attending: Surgery | Admitting: Surgery

## 2024-04-07 ENCOUNTER — Other Ambulatory Visit (HOSPITAL_COMMUNITY): Payer: Self-pay | Admitting: Surgery

## 2024-04-07 DIAGNOSIS — R590 Localized enlarged lymph nodes: Secondary | ICD-10-CM | POA: Diagnosis present

## 2024-04-07 HISTORY — PX: IR US LIVER BIOPSY: IMG936

## 2024-04-07 MED ORDER — LIDOCAINE-EPINEPHRINE 1 %-1:100000 IJ SOLN
INTRAMUSCULAR | Status: AC
  Filled 2024-04-07: qty 20

## 2024-04-07 MED ORDER — LIDOCAINE-EPINEPHRINE (PF) 1 %-1:200000 IJ SOLN
10.0000 mL | Freq: Once | INTRAMUSCULAR | Status: AC
  Administered 2024-04-07: 10 mL

## 2024-04-11 ENCOUNTER — Ambulatory Visit: Payer: Self-pay

## 2024-04-11 NOTE — Telephone Encounter (Signed)
 Noted

## 2024-04-11 NOTE — Telephone Encounter (Signed)
 FYI Only or Action Required?: FYI only for provider: appointment scheduled on 1.14.26.  Patient was last seen in primary care on 01/21/2024 by Bennett Reuben POUR, MD.  Called Nurse Triage reporting Neck Pain and Wound Infection.  Symptoms began several days ago.  Interventions attempted: OTC medications: anbesol and Rest, hydration, or home remedies.  Symptoms are: gradually improving.  Triage Disposition: See Physician Within 24 Hours  Patient/caregiver understands and will follow disposition?: Yes      Copied from CRM #[id number]. Topic: Clinical - Red Word Triage >> Apr 11, 2024 11:45 AM Drema MATSU wrote: Red Word that prompted transfer to Nurse Triage: Biopsy procedure on neck on Friday and pt neck and jaw is swollen. He is not sure if it is an infection or not. Reason for Disposition  [1] INCREASING pain in incision AND [2] > 2 days (48 hours) since surgery  Answer Assessment - Initial Assessment Questions Pt had ultrasound guided biopsy on Friday of right submandibular cervical lymph node. He states that he isn't sure if the guy went into his jaw or jaw bone. He states his neck and jaw have been swollen on that side and it is hard to swallow. He states he hasn't been to work because any pulling on that side hurts. He denies any bleeding, drainage on the outside, redness. Just swelling and has been using anbesol for the place between his teeth and cheek. All of this is due to a knot on the right side of his neck. He denies any fevers. Pt states pain last night was terrible but much more tolerable today. He is requesting for someone to look at it to rule out infection. [facility] had no appts available. Scheduled for [facility]. Did advise pt if symptoms worsen (pain, swelling, drainage, redness) to go to ER or UC instead. Pt stated understanding.   PT also wanted staff to be aware that he didn't have a great experience, he said that he said something and the doctor yelled at him  and told him he is NOT to be talking. Pt was unhappy with the interaction and is concerned something is wrong.    1. SYMPTOM: What's the main symptom you're concerned about? (e.g., drainage, incision opened up, pain, redness)     swelling 2. ONSET: When did swelling  start?     Pretty much since the start.  3. SURGERY: What surgery did you have?     Right submandibular cervical lymph node US  guided biopsy 4. DATE of SURGERY: When was the surgery?      04/07/24 5. INCISION SITE: Where is the incision located?      Right neck/jaw 6. REDNESS: Is there any redness at the incision site? If Yes, ask: How wide across is the redness? (Inches, centimeters)      denies 7. PAIN: Is there any pain? If Yes, ask: How bad is it?  (Scale 0-10; or none, mild, moderate, severe)     Yes moderate today 8. BLEEDING: Is there any bleeding? If Yes, ask: How much? and Where?     denies 9. DRAINAGE: Is there any drainage from the incision site? If Yes, ask: What color and how much? (e.g., red, cloudy, pus; drops, teaspoon)     denies 10. FEVER: Do you have a fever? If Yes, ask: What is your temperature, how was it measured, and when did it start?       denies 11. OTHER SYMPTOMS: Do you have any other symptoms? (e.g., dizziness, rash  elsewhere on body, shaking chills, weakness)       swelling  Protocols used: Post-Op Incision Symptoms and Questions-A-AH

## 2024-04-12 ENCOUNTER — Ambulatory Visit: Admitting: Nurse Practitioner

## 2024-04-12 LAB — SURGICAL PATHOLOGY

## 2024-04-12 NOTE — Telephone Encounter (Signed)
 Dr. Bennett - Received a phone call from leadership at [facility], pt was schedule to be seen at another office today.  Provider he is scheduled with does not feel that it is appropriate to be seen in their office.  Attempted to direct patient back to surgeon's office that did biopsy.  Patient does not want to go back to surgeons office and will likely not call.  Can you work him in today or tomorrow?

## 2024-04-12 NOTE — Telephone Encounter (Signed)
 Called patient due missing appt at office, and advised concern for waiting to be seen and that patient should really call surgeon office to be seen due to symptoms are a concern at biopsy site. With patient stating that swelling has come down but that activity increases swelling and that face feels warm to touch at biopsy site Compared to other side of face. No drainage per patient just pain and swelling first 72 hours has been gargling with Listerine hoping to stop if infection occurring. Patient stated he had no after care instructions to know where to call for advice from surgeon office. Office located surgeon name and number given to patient Dr. Krystal Spinner at [facility] Surgery [phone] patient was advised to call for follow up care and to be seen . Advised PCP office.

## 2024-04-12 NOTE — Telephone Encounter (Signed)
 Dr. Bennett had agreed to see pt at 8am 04/13/24.  Clotilda, CMA called pt.  Writer cancelled appt with Leron Glance, NP for 04/13/24

## 2024-04-13 ENCOUNTER — Telehealth: Payer: Self-pay

## 2024-04-13 ENCOUNTER — Ambulatory Visit

## 2024-04-13 ENCOUNTER — Ambulatory Visit: Admitting: Nurse Practitioner

## 2024-04-13 VITALS — BP 126/76 | HR 66 | Temp 98.0°F | Ht 68.0 in | Wt 142.0 lb

## 2024-04-13 DIAGNOSIS — C7989 Secondary malignant neoplasm of other specified sites: Secondary | ICD-10-CM

## 2024-04-13 DIAGNOSIS — R220 Localized swelling, mass and lump, head: Secondary | ICD-10-CM

## 2024-04-13 DIAGNOSIS — C801 Malignant (primary) neoplasm, unspecified: Secondary | ICD-10-CM

## 2024-04-13 MED ORDER — AMOXICILLIN-POT CLAVULANATE 875-125 MG PO TABS: 1.0000 | ORAL_TABLET | Freq: Two times a day (BID) | ORAL | 0 refills | Status: AC

## 2024-04-13 MED ORDER — OXYCODONE-ACETAMINOPHEN 5-325 MG PO TABS: 1.0000 | ORAL_TABLET | Freq: Three times a day (TID) | ORAL | 0 refills | Status: AC | PRN

## 2024-04-13 NOTE — Progress Notes (Signed)
 Oncology Nurse Navigator Documentation   I received notice today that Jeffrey Mcbride had been referred to medical oncology by his PCP Dr. Bennett. He has been seen by Dr. Eletha of general surgery and had a biopsy that was completed on 04/07/24 revealing metastatic moderately differentiated SCC in a right cervical lymph node. He had been referred to [facility]NT on 03/18/24 but declined the appointment due to confusion about the need for the appointment. I discussed the referral with Dr. Autumn (medical oncologist) and he recommends consult with ENT as soon as possible for evaluation and possible PET scan. I explained the need for evaluation by ENT to Jeffrey Mcbride and he agreed to an appointment. I have notified [facility] that he is ready to schedule and will follow for the appointment date/time. I offered to provide Jeffrey Mcbride with my direct contact information and he declined.  Delon Jefferson RN, BSN, OCN Head & Neck Oncology Nurse Navigator [facility] Phone # [phone]  Fax # [phone]

## 2024-04-13 NOTE — Progress Notes (Signed)
 "  Subjective:   This visit was conducted in person. The patient gave informed consent to the use of Abridge AI technology to record the contents of the encounter as documented below.   Patient ID: Jeffrey Mcbride, male    DOB: 06/10/1955, 69 y.o.   MRN: [id number]   Discussed the use of AI scribe software for clinical note transcription with the patient, who gave verbal consent to proceed.  History of Present Illness Jeffrey Mcbride is a 69 year old male who presents with facial swelling and pain following a recent biopsy.  He underwent a biopsy on January 9th and subsequently experienced significant facial swelling, described as 'like a chipmunk,' extending from his face to his neck and ear. The swelling was accompanied by pain, particularly when bending over, which radiated to his neck and ear. He also thinks there's a small abscess between his gum and tooth that was not present before the procedure.  He has been unable to work due to the pain and swelling, which worsens with physical activity. He has been gargling with salt water to manage the swelling. He attempted to contact the office regarding his symptoms but did not receive a response. He missed an appointment with a nurse practitioner due to a scheduling misunderstanding and was unable to be seen.  He experienced a fever at home, with a temperature reaching around 100F, but is not currently febrile. He has been experiencing significant pain, leading to increased use of his pain medication, which is due for renewal. He has been coughing up green sputum and noted that his face was red and throbbing during the swelling episodes.  He is currently taking Roxasept (oxycodone  acetaminophen ) at a dose of two and a half pills per day to manage his pain. He has a family history of cancer, as his wife was diagnosed with squamous cell carcinoma. He has a daughter who is a engineer, civil (consulting) and is in the final months of her nursing program. He travels  for work and wants to return to work next week, despite his current inability to do so due to his symptoms.   Review of Systems  All other systems reviewed and are negative.       Allergies[1]  Medications Ordered Prior to Encounter[2]  BP 126/76 (BP Location: Left Arm, Patient Position: Sitting, Cuff Size: Normal)   Pulse 66   Temp 98 F (36.7 C) (Oral)   Ht 5' 8 (1.727 m)   Wt 142 lb (64.4 kg)   SpO2 97%   BMI 21.59 kg/m   Objective:      Physical Exam VITALS: T- 98.0 GENERAL: Alert, cooperative, well developed, no acute distress. HEAD: Normocephalic atraumatic.  Mild swelling of the right lower jaw area, no erythema EYES: Extraocular movements intact BL, pupils round, equal and reactive to light BL, conjunctivae normal BL. EARS: Tympanic membrane, ear canal and external ear normal BL. NOSE: No congestion or rhinorrhea, mucous membranes are moist. THROAT: No oropharyngeal exudate or posterior oropharyngeal erythema. Throat normal. ORAL: No oral abscess visible, no erythema or swelling of the buccal/gingival tissue NECK: Enlarged right cervical lymph node, mild lymphadenopathy on left.       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 He underwent ultrasound-guided biopsy on  1/9 of the right submandibular cervical lymph node, pathology showed metastatic, moderately differentiated squamous cell carcinoma.  Results extensively discussed with patient, I have answered all his questions to the best of my ability. Reviewed procedure note from 1/9 by Dr. Jennefer stating no immediate complications or signs of hematoma at the time of the procedure.  Patient has been complaining of jaw pain and swelling which began a few  days after the procedure.  Clinical exam today notable only for mild right sided jaw swelling, no visible abscess, erythema or drainage from the area of biopsy.  There is no visible hematoma.  Will treat empirically with antibiotics given reported history of low-grade fever and reported worse swelling and erythema over the last few days. Will further have him follow-up with ENT and oncology in light of pathology results.  - Referred to ENT specialist for further evaluation and management. - Referred to oncologist for cancer treatment planning. - Prescribed Augmentin  for 7 days, one tablet twice daily. - Scheduled urgent appointment with general surgery to re-examine biopsy site in the coming days - Advised to call ENT to schedule an appointment for further evaluation. - Roxicet prescription refilled for pain control   Patient will follow-up in about 3 weeks with new PCP Lynwood Crandall.  Legna Mausolf K Jessabelle Markiewicz, MD  04/13/24     Contains text generated by Abridge.        [1]  Allergies Allergen Reactions   Latex    Motrin [Ibuprofen] Other (See Comments)    High doses gives pt gas   Naprosyn [Naproxen] Other (See Comments)    Elevated blood pressure  [2]  Current Outpatient Medications on File Prior to Visit  Medication Sig Dispense Refill   ibuprofen (ADVIL) 200 MG tablet Take 200 mg by mouth every 6 (six) hours as needed.     methocarbamol  (ROBAXIN ) 500 MG tablet Take 1 tablet (500 mg total) by mouth 2 (two) times daily as needed for muscle spasms. 60 tablet 1   No current facility-administered medications on file prior to visit.   "

## 2024-04-13 NOTE — Telephone Encounter (Signed)
 Spoke to pt

## 2024-04-13 NOTE — Telephone Encounter (Signed)
 Copied from CRM [id number]. Topic: General - Other >> Apr 13, 2024 12:00 PM Darshell M wrote: Reason for CRM: Patient requesting a return to work letter for Monday. Patient will come pick letter up when it is ready. Patient does not use mychart. Patient CB# [phone]

## 2024-04-13 NOTE — Telephone Encounter (Signed)
 [facility] to make pt an appt with his surgeon the 19th or 20th at Dr Charlyn request to f/u on issues of swelling and pain at the biopsy site.   The 1st person I spoke to looked at his chart and read the note that said he needed to F/U with IR since he is having issues.   I called IR and they just do the procedure. They do not treat pts if they are having an issues. They are referred back to their specialists who ordered it.   Called back to [facility] and spoke to [place]. She read the note from the pt 2 days, again, and made the same comment about him going back to IR. Explained what IR said. She then transferred me to a triage nurse, [facility], who read the same message to me, again. I explained that Dr Bennett wants him evaluated by the specialists to make sure nothing else is going on. She said Dr Eletha is out of the office until April and they are closed on Monday. She then transferred me to another nurse, Nat.   Nat told me the PA who is taking care of things while Dr Eletha is out said they would not see the pt. I explained that Dr Bennett wants the pt seen to have the area evaluated. She started him on antibiotics but thinks he needs to follow-up there. She said no other provider in the office usually does the issue he is having. I questioned why the pt should have to wait until April. She put me on hold.   Came back and said that Dr Polly is the on-call doctor on 04-19-24. He can put the pt on at 10 am. But, his wife is very pregnant and he may not be seen if she is in labor. They have also reached out to IR.

## 2024-04-13 NOTE — Patient Instructions (Addendum)
 Thank you for visiting [facility] today! Here's what we talked about - START Augmentin  - Call ENT at [phone] to schedule appointment - Call Oncology if you do not hear from them in 2 weeks  - We will get General Surgery to get you re-evaluated

## 2024-04-13 NOTE — Telephone Encounter (Signed)
 Sending to Dr Bennett for approval

## 2024-04-14 NOTE — Telephone Encounter (Unsigned)
 Copied from CRM [id number]. Topic: General - Other >> Apr 14, 2024 11:30 AM Winona SAUNDERS wrote: pt requesting a return to work letter

## 2024-04-19 ENCOUNTER — Institutional Professional Consult (permissible substitution) (INDEPENDENT_AMBULATORY_CARE_PROVIDER_SITE_OTHER)

## 2024-04-20 ENCOUNTER — Encounter (INDEPENDENT_AMBULATORY_CARE_PROVIDER_SITE_OTHER): Payer: Self-pay

## 2024-04-20 ENCOUNTER — Ambulatory Visit (INDEPENDENT_AMBULATORY_CARE_PROVIDER_SITE_OTHER)

## 2024-04-20 VITALS — BP 128/80 | HR 63 | Ht 68.0 in | Wt 140.0 lb

## 2024-04-20 DIAGNOSIS — C01 Malignant neoplasm of base of tongue: Secondary | ICD-10-CM | POA: Diagnosis not present

## 2024-04-20 NOTE — Progress Notes (Signed)
 Dear Dr. Bennett, Here is my assessment for our mutual patient, Jeffrey Mcbride. Thank you for allowing me the opportunity to care for your patient. Please do not hesitate to contact me should you have any other questions. Sincerely, Dr. Hadassah Parody  Otolaryngology Clinic Note Referring provider: Dr. Bennett HPI:   Initial HPI (04/20/2024)  69 year old male with newly diagnosed p16 positive squamous cell carcinoma of right tongue base with right neck lymphadenopathy.  Patient reports he noted hard palpable neck mass in July or August 2025.    He ended up getting a core biopsy of a right neck lymph node that came back as p16 positive squamous cell.  CT neck was obtained and showed an obvious right tongue base exophytic mass correlating to the primary site.  He was then referred to ENT.  Neck mass tends to fluctuate in size.  He has no associated throat pain  No pain, no difficulty breathing or swallowing.  He has never smoked. No history of pulmonary or cardiac disease.   Independent Review of Additional Tests or Records:  CT neck 03/10/2024 independently reviewed showing exophytic right tongue base mass, right level 2 and 3 lymph nodes  Surge path 04/07/2024 reviewed right cervical lymph node: Metastatic moderately differentiated squamous cell carcinoma, p16 positive   PMH/Meds/All/SocHx/FamHx/ROS:   Past Medical History:  Diagnosis Date   Allergic rhinitis due to pollen    Back pain    Diverticulosis    External hemorrhoids    Hypertension    Insomnia    Internal hemorrhoids    Lumbar back pain with radiculopathy affecting left lower extremity    Tubular adenoma of colon      Past Surgical History:  Procedure Laterality Date   BACK SURGERY     IR US  LIVER BIOPSY  04/07/2024   LUMBAR DISC SURGERY  2004   LUMBAR FUSION     Dr Joshua   VASECTOMY      Family History  Problem Relation Age of Onset   Heart disease Mother    Hyperlipidemia Mother    Heart disease Father     Hyperlipidemia Father    Alcohol abuse Sister    Breast cancer Daughter    Esophageal cancer Neg Hx    Colon cancer Neg Hx    Rectal cancer Neg Hx    Stomach cancer Neg Hx      Social Connections: Not on file     Current Outpatient Medications  Medication Instructions   amoxicillin -clavulanate (AUGMENTIN ) 875-125 MG tablet 1 tablet, Oral, 2 times daily   ibuprofen (ADVIL) 200 mg, Every 6 hours PRN   methocarbamol  (ROBAXIN ) 500 mg, Oral, 2 times daily PRN   oxyCODONE -acetaminophen  (ROXICET) 5-325 MG tablet 1 tablet, Oral, 3 times daily PRN     Physical Exam:   BP 128/80 (BP Location: Left Arm, Patient Position: Sitting)   Pulse 63   Ht 5' 8 (1.727 m)   Wt 140 lb (63.5 kg)   SpO2 97%   BMI 21.29 kg/m   Salient findings:  CN II-XII intact    Bilateral EAC clear and TM intact with well pneumatized middle ear spaces  Anterior rhinoscopy: Septum midline  No lesions of oral cavity-unable to see mass at the tongue base with transoral exam  Firm right neck lymphadenopathy level 2 and 3  No respiratory distress or stridor TFL was indicated to better evaluate the proximal airway, given the patient's history and exam findings, and is detailed below.   Seprately Identifiable Procedures:  Prior to initiating any procedures, risks/benefits/alternatives were explained to the patient and verbal consent obtained.  Procedure Note (04/20/2024) Pre-procedure diagnosis: Right tongue base mass Post-procedure diagnosis: Same Procedure: Transnasal Fiberoptic Laryngoscopy, CPT 31575 - Mod 25 Indication: Right tongue base mass Complications: None apparent EBL: 0 mL  The procedure was undertaken to further evaluate the patient's complaint of right tongue base mass, with mirror exam inadequate for appropriate examination due to gag reflex and poor patient tolerance  Procedure:  Patient was identified as correct patient. Verbal consent was obtained. The nose was sprayed with oxymetazoline  and 4% lidocaine . The The flexible laryngoscope was passed through the nose to view the nasal cavity, pharynx (oropharynx, hypopharynx) and larynx.  The larynx was examined at rest and during multiple phonatory tasks. Documentation was obtained and reviewed with patient. The scope was removed. The patient tolerated the procedure well.  Findings: The nasal cavity and nasopharynx did not reveal any masses or lesions, mucosa appeared to be without obvious lesions. The tongue base, pharyngeal walls, piriform sinuses, vallecula, epiglottis and postcricoid region are normal in appearance EXCEPT: Large exophytic mass at the right tongue base, able to visualize the airway easily. The visualized portion of the subglottis and proximal trachea is widely patent. The vocal folds are mobile bilaterally. There are no lesions on the free edge of the vocal folds nor elsewhere in the larynx worrisome for malignancy.    Electronically signed by: Hadassah JAYSON Parody, MD 04/20/2024 4:09 PM   Impression & Plans:  Jeffrey Mcbride is a 69 y.o. male with     ICD-10-CM   1. Squamous cell carcinoma of base of tongue (HCC)  C01      Assessment and Plan Assessment & Plan P16+ squamous cell carcinoma of the base of tongue  squamous cell carcinoma of the base of tongue, confirmed by biopsy and imaging.  CT scan reviewed, appears exophytic and does not appear to invade into the tongue musculature significantly, favoring the possibility for surgical resection.  Both primary chemoradiation and surgical resection are viable options and we discussed this.  Patient prefers surgical resection is the primary treatment.  Discussed referral to [facility] to discuss with surgeons there.  he agrees with the plan. - Performed flexible nasopharyngolaryngoscopy for direct tumor visualization. - Reviewed imaging to assess tumor extent and surgical candidacy. - placed urgent referral to head and neck cancer surgery at [facility] for  evaluation and possible surgical management should they deem him appropriate for surgery - Discussed anticipated surgical course, including hospitalization and recovery expectations. - Advised him to bring FMLA paperwork to the surgical team for work accommodations.    See below regarding exact medications prescribed this encounter including dosages and route: No orders of the defined types were placed in this encounter.     Thank you for allowing me the opportunity to care for your patient. Please do not hesitate to contact me should you have any other questions.  Sincerely, Hadassah Parody, MD Otolaryngologist (ENT), [facility] Phone: [phone] Fax: [phone]  MDM:  Level 4 Complexity/Problems addressed: 5-problem that poses a threat to life Data complexity: 4-  independent review of CT scan - Morbidity: -   - Prescription Drug prescribed or managed:

## 2024-04-21 ENCOUNTER — Telehealth (INDEPENDENT_AMBULATORY_CARE_PROVIDER_SITE_OTHER): Payer: Self-pay

## 2024-04-21 ENCOUNTER — Encounter (INDEPENDENT_AMBULATORY_CARE_PROVIDER_SITE_OTHER): Payer: Self-pay

## 2024-04-21 NOTE — Telephone Encounter (Signed)
 Patient called and wanted to let you know that he got the message regarding an appointment at [facility] and is going to call and make the appointment.  If you have any questions, he can be reached at [phone]

## 2024-04-22 NOTE — Telephone Encounter (Signed)
 Please call patient and advise that return to work letter is ready for pickup

## 2024-04-25 ENCOUNTER — Ambulatory Visit: Payer: Self-pay | Admitting: Surgery

## 2024-04-25 NOTE — Progress Notes (Signed)
 Telephone call to patient to review path and express the need for evaluation/follow-up by ENT [facility]) and radiation oncology.  LMOM.  Patient needs to be seen by ENT/Head and Neck surgery for this type of tumor.  General surgery does not treat this type of cancer.  He also needs to see radiation oncology / oncology for consideration of further treatment.  I will make sure that a referral has been placed, either by his primary physician (Dr. Bennett) or by my  practice.  [facility] - please follow up on these referrals.  Krystal Spinner, MD [facility] practice Office: [phone]

## 2024-04-26 ENCOUNTER — Telehealth: Payer: Self-pay | Admitting: Radiation Oncology

## 2024-04-26 NOTE — Telephone Encounter (Signed)
 Pt called inquiring about a possible referral to our office. Pt was advised that at this time our office does not have a referral. He stated he had a missed call from [facility] but wanted to make sure it wasn't meant to be from our office. I advised him to listen to VM and c/b that office to see what appt was needed. I advised I would let H&N navigator know about call and that at this time pt is awaiting instructions for appts to address lymph node and tongue.

## 2024-04-27 ENCOUNTER — Telehealth (INDEPENDENT_AMBULATORY_CARE_PROVIDER_SITE_OTHER): Payer: Self-pay

## 2024-04-27 NOTE — Progress Notes (Signed)
 Oncology Nurse Navigator Documentation   I spoke with Jeffrey Mcbride on the phone this morning. He verbalized that he is confused on how to proceed regarding his newly diagnosed head and neck cancer. I spoke with him last week and got him scheduled to see Dr. Greggory at [facility] ENT for evaluation. He consulted with her and was given information regarding treatments for his cancer. He expressed interest in a surgical consultation and was urgently referred to and scheduled at [facility]. He spoke with Dr. Eletha yesterday and his understanding was that Dr. Eletha wanted him to see [facility] ENT. I attempted to explain to [name] that he has now seen [facility] and was referred to [facility] to discuss surgery but he has cancelled his consult at [facility] for now. He feels like he is getting the run around from all the people he has communicated with and prefers to wait to see Jeffrey Crandall NP (family medicine) on 2/4 to discuss his options. I have sent a message to Jeffrey Mcbride to update him on the situation.   Jeffrey Jefferson RN, BSN, OCN Head & Neck Oncology Nurse Navigator [facility] Phone # [phone]  Fax # [phone]

## 2024-04-27 NOTE — Telephone Encounter (Signed)
 The patient called in confused as to where he needs to go.  He spoke with Dr Eletha with his results.  He stated that Dr Eletha told him to stay with out office for his treatment, so he cancelled his appointment he had to go to the [facility] ENT that Dr Greggory referred him to. Please advise, he needs clarification since he has gotten 2 different recommendations now, one from Dr Mascello and one with Dr Eletha.  He is aware Dr Greggory will not be back until Tuesday and he is fine waiting until then for clarification.

## 2024-04-30 NOTE — Telephone Encounter (Signed)
 Just an update, referral to ENT was placed, patient was evaluated by them and it was decided that he will undergo surgical resection with [facility], referral was placed to their head and neck cancer surgery unit, and patient was scheduled.  However, patient got confused about the next steps his care and canceled his [facility] appointment.  Patient was then contacted by an oncology navigator nurse with instructions about the next steps in his care but patient declined to reschedule with [facility], preferring to discuss with his new PCP, my colleague, Lynwood cable, upcoming appointment in a few days.  Oncology nurse navigator did also contact James cable to update him on the situation.  I have also discussed with Lynwood Crandall about this patient and the plan to assist him in navigating the next steps his care/scheduling with head and neck surgery.  Thanks, Dr. Bennett

## 2024-05-03 ENCOUNTER — Ambulatory Visit: Admitting: Nurse Practitioner

## 2024-05-03 ENCOUNTER — Encounter: Payer: Self-pay | Admitting: Nurse Practitioner

## 2024-05-03 VITALS — BP 130/62 | HR 52 | Temp 98.3°F | Ht 67.75 in | Wt 140.8 lb

## 2024-05-03 DIAGNOSIS — I10 Essential (primary) hypertension: Secondary | ICD-10-CM

## 2024-05-03 DIAGNOSIS — C7989 Secondary malignant neoplasm of other specified sites: Secondary | ICD-10-CM | POA: Insufficient documentation

## 2024-05-03 DIAGNOSIS — M5416 Radiculopathy, lumbar region: Secondary | ICD-10-CM

## 2024-05-03 DIAGNOSIS — F1120 Opioid dependence, uncomplicated: Secondary | ICD-10-CM

## 2024-05-03 NOTE — Assessment & Plan Note (Addendum)
 Currently maintained on lifestyle modifications only.  Blood pressure controlled

## 2024-05-03 NOTE — Telephone Encounter (Signed)
 The patient called back, he wanted to let Dr Greggory that he did get her message and he thanks her for her comments.  He has an appointment today with a new PCP and he will go over these things with them today.

## 2024-05-03 NOTE — Assessment & Plan Note (Signed)
 No red flags to review most recent drug screen

## 2024-05-03 NOTE — Assessment & Plan Note (Signed)
 History of the same currently maintained on Percocet 3 times daily as needed.  Patient prescription drug last over the month.  No constipation or adverse drug events per his report continue medication as prescribed he will reach out when he needs a refill

## 2024-05-03 NOTE — Assessment & Plan Note (Addendum)
 Recent diagnosis.  Patient has seen ENT and general surgery.  Local ENT referred patient to head and neck surgery at [facility].  Had discussion with patient and radiation oncologist.  Patient prefers to do radiation oncology consult prior to seeing head and neck surgery.  Images referral placed today message sent to radiation oncologist.  No difficulty swallowing or talking not tender to palpation

## 2024-05-03 NOTE — Patient Instructions (Signed)
 Nice to see you today I have referred you to Dr. Izell Follow up with me in 3 months, sooner if you need me

## 2024-05-03 NOTE — Progress Notes (Signed)
 "  Established Patient Office Visit  Subjective   Patient ID: Nester Bachus, male    DOB: 08-13-1955  Age: 69 y.o. MRN: [id number]  Chief Complaint  Patient presents with   Transitions Of Care    HPI  Metastatic squamous cell carcinoma: Patient was seen and evaluated On 11/15/2023 for a neck mass.  Patient was referred to general surgery for biopsy/excision.  Patient never followed up came back in on 01/21/2024 was reevaluated with ultrasound of head and neck ordered and referral to general surgery.  Ultrasound showed 2 circumscribed hypoechoic right lateral neck masses and recommended CT scan of neck.  CT scan was obtained that showed a mass arising from the right base of tongue narrowing the hypopharyngeal airway concerning for malignancy along with enlarged lymph nodes likely representing a metastatic disease.  Patient followed up with general surgery and lymph node biopsy was performed.  This showed metastatic squamous cell carcinoma.  Patient has been referred to ENT and head and neck surgery.  He has yet to make an appointment with head and neck surgery at [facility]. He is not having discomfort, difficulty swallowing, talking, or breathing. He does have a sore throat on occasion   Lumbar pain: Patient currently maintained on oxycodone -acetaminophen  5-325 mg 3 times daily as needed pain. He states that the prescription will last him two months sometimes. 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  Patient due 2026 Lung Cancer Screening: N/A  PSA:        Review of Systems  Constitutional:  Negative for chills and fever.  Respiratory:  Negative for shortness of breath.   Cardiovascular:  Negative for chest pain.   Gastrointestinal:  Negative for constipation.       Bm daily       Objective:     BP 130/62   Pulse (!) 52   Temp 98.3 F (36.8 C) (Oral)   Ht 5' 7.75 (1.721 m)   Wt 140 lb 12.8 oz (63.9 kg)   SpO2 99%   BMI 21.57 kg/m  BP Readings from Last 3 Encounters:  05/03/24 130/62  04/20/24 128/80  04/13/24 126/76   Wt Readings from Last 3 Encounters:  05/03/24 140 lb 12.8 oz (63.9 kg)  04/20/24 140 lb (63.5 kg)  04/13/24 142 lb (64.4 kg)   SpO2 Readings from Last 3 Encounters:  05/03/24 99%  04/20/24 97%  04/13/24 97%      Physical Exam Vitals and nursing note reviewed.  Constitutional:      Appearance: Normal appearance.  HENT:     Mouth/Throat:     Mouth: Mucous membranes are moist.     Pharynx: Oropharynx is clear.  Cardiovascular:     Rate and Rhythm: Normal rate and regular rhythm.     Heart sounds: Normal heart sounds.  Pulmonary:     Effort: Pulmonary effort is normal.     Breath sounds: Normal breath sounds.  Abdominal:     General: Bowel sounds are normal. There is no distension.     Palpations: There is no mass.     Tenderness: There is no abdominal tenderness.     Hernia: No hernia is present.  Lymphadenopathy:     Cervical: Cervical adenopathy (mass on right superior neck) present.  Neurological:     Mental  Status: He is alert.      No results found for any visits on 05/03/24.    The 10-year ASCVD risk score (Arnett DK, et al., 2019) is: 17.2%    Assessment & Plan:   Problem List Items Addressed This Visit       Cardiovascular and Mediastinum   Hypertension - Primary   Currently maintained on lifestyle modifications only.  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  Patient has seen ENT and general surgery.  Local ENT referred patient to head and neck surgery at [facility].  Had discussion with patient and  radiation oncologist.  Patient prefers to do radiation oncology consult prior to seeing head and neck surgery.  Images referral placed today message sent to radiation oncologist.  No difficulty swallowing or talking not tender to palpation      Relevant Orders   Ambulatory referral to Radiation Oncology     Nervous and Auditory   Lumbar back pain with radiculopathy affecting left lower extremity   History of the same currently maintained on Percocet 3 times daily as needed.  Patient prescription drug last over the month.  No constipation or adverse drug events per his report continue medication as prescribed he will reach out when he needs a refill        Other   Narcotic dependence (HCC)   No red flags to review most recent drug screen       Return in about 3 months (around 07/31/2024) for Medication recheck/pain.    Adina Crandall, NP  "

## 2024-05-04 ENCOUNTER — Other Ambulatory Visit: Payer: Self-pay | Admitting: Nurse Practitioner

## 2024-05-04 DIAGNOSIS — C7989 Secondary malignant neoplasm of other specified sites: Secondary | ICD-10-CM

## 2024-05-04 NOTE — Progress Notes (Signed)
 Oncology Nurse Navigator Documentation   I spoke with Jeffrey Mcbride this afternoon after seeing that he was scheduled with Dr. Lenn with radiation oncology at [facility]. He confirmed that he would prefer treatment for his head and neck cancer there due to it being closer to his home. I have informed Dr. Izell, Dr. Autumn, and Lynwood Crandall NP of his decision. A referral has been placed to medical oncology at [facility] as well. Jeffrey Mcbride has my direct number and knows to call me if I can help with any questions or concerns.   Delon Jefferson RN, BSN, OCN Head & Neck Oncology Nurse Navigator  Cancer Center at [facility] Phone # [phone]  Fax # [phone]

## 2024-05-11 ENCOUNTER — Ambulatory Visit: Admitting: Radiation Oncology

## 2024-08-04 ENCOUNTER — Ambulatory Visit: Admitting: Nurse Practitioner
